# Patient Record
Sex: Female | Born: 1960 | Race: White | Hispanic: No | Marital: Single | Smoking: Never smoker
Health system: Southern US, Community
[De-identification: ages and names within clinical notes are randomized; demographics above are authoritative.]

## PROBLEM LIST (undated history)

## (undated) DIAGNOSIS — M459 Ankylosing spondylitis of unspecified sites in spine: Secondary | ICD-10-CM

## (undated) DIAGNOSIS — E785 Hyperlipidemia, unspecified: Secondary | ICD-10-CM

## (undated) DIAGNOSIS — G43909 Migraine, unspecified, not intractable, without status migrainosus: Secondary | ICD-10-CM

## (undated) DIAGNOSIS — I10 Essential (primary) hypertension: Secondary | ICD-10-CM

## (undated) HISTORY — DX: Ankylosing spondylitis of unspecified sites in spine: M45.9

## (undated) HISTORY — DX: Hyperlipidemia, unspecified: E78.5

## (undated) HISTORY — DX: Migraine, unspecified, not intractable, without status migrainosus: G43.909

## (undated) HISTORY — DX: Essential (primary) hypertension: I10

## (undated) HISTORY — PX: OTHER SURGICAL HISTORY: SHX169

## (undated) HISTORY — PX: COLONOSCOPY: SHX174

---
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
## 1980-02-13 HISTORY — PX: OTHER SURGICAL HISTORY: SHX169

## 1983-02-13 HISTORY — PX: OTHER SURGICAL HISTORY: SHX169

## 1986-02-12 HISTORY — PX: ABDOMINAL HYSTERECTOMY: SHX81

## 2016-04-13 DIAGNOSIS — M459 Ankylosing spondylitis of unspecified sites in spine: Secondary | ICD-10-CM | POA: Insufficient documentation

## 2016-04-13 DIAGNOSIS — I10 Essential (primary) hypertension: Secondary | ICD-10-CM | POA: Insufficient documentation

## 2016-04-13 DIAGNOSIS — G43909 Migraine, unspecified, not intractable, without status migrainosus: Secondary | ICD-10-CM | POA: Insufficient documentation

## 2017-05-17 LAB — HM MAMMOGRAPHY

## 2018-04-14 ENCOUNTER — Encounter: Payer: Self-pay | Admitting: Physician Assistant

## 2018-04-14 ENCOUNTER — Ambulatory Visit: Payer: BC Managed Care – PPO | Admitting: Physician Assistant

## 2018-04-14 VITALS — BP 120/82 | HR 115 | Temp 100.7°F | Ht 64.0 in | Wt 182.0 lb

## 2018-04-14 DIAGNOSIS — R6889 Other general symptoms and signs: Secondary | ICD-10-CM | POA: Diagnosis not present

## 2018-04-14 LAB — POC INFLUENZA A&B (BINAX/QUICKVUE)
Influenza A, POC: POSITIVE — AB
Influenza B, POC: NEGATIVE

## 2018-04-14 MED ORDER — OSELTAMIVIR PHOSPHATE 75 MG PO CAPS: 75.0000 mg | ORAL_CAPSULE | Freq: Two times a day (BID) | ORAL | 0 refills | Status: DC

## 2018-04-14 NOTE — Patient Instructions (Signed)
It was great to see you!  You have influenza A.  Start tamiflu.  Use tylenol to control fever.  Push fluids and get plenty of rest. Please return if you are not improving as expected, or if you have high fevers (>101.5) or difficulty swallowing or worsening productive cough.  Call clinic with questions.  I hope you start feeling better soon!

## 2018-04-14 NOTE — Progress Notes (Signed)
Alison Mahoney is a 58 y.o. female here for a new problem.  History of Present Illness:   Chief Complaint  Patient presents with  . Generalized Body Aches    x3 days  . Cough    wet cough  . Headache    HPI  Patient presents to establish care and discuss acute issue.  She has a history of high blood pressure and migraines.  She is here for generalized body aches  x 3 days, wet cough, headache started on Saturday night. Denies: sore throat. Denies hx of PNA, asthma. Does have history of flu. Has had chills. Appetite is good. Staying well hydrated.      Past Medical History:  Diagnosis Date  . Ankylosing spondylitis ([place])   . Hypertension   . Migraines      Social History   Socioeconomic History  . Marital status: Single    Spouse name: Not on file  . Number of children: 3  . Years of education: Not on file  . Highest education level: Not on file  Occupational History  . Not on file  Social Needs  . Financial resource strain: Not on file  . Food insecurity:    Worry: Not on file    Inability: Not on file  . Transportation needs:    Medical: Not on file    Non-medical: Not on file  Tobacco Use  . Smoking status: Never Smoker  . Smokeless tobacco: Never Used  Substance and Sexual Activity  . Alcohol use: Not Currently  . Drug use: Not Currently  . Sexual activity: Not Currently  Lifestyle  . Physical activity:    Days per week: Not on file    Minutes per session: Not on file  . Stress: Not on file  Relationships  . Social connections:    Talks on phone: Not on file    Gets together: Not on file    Attends religious service: Not on file    Active member of club or organization: Not on file    Attends meetings of clubs or organizations: Not on file    Relationship status: Not on file  . Intimate partner violence:    Fear of current or ex partner: Not on file    Emotionally abused: Not on file    Physically abused: Not on file    Forced sexual activity:  Not on file  Other Topics Concern  . Not on file  Social History Narrative   Moved from [place] in [place]; 3 children   Currently works as a Education officer, museum    Past Surgical History:  Procedure Laterality Date  . ABDOMINAL HYSTERECTOMY  1988  . lt hand surgery  1985  . Lt middle toe surgery  1977  . Rt thumb joint replacement  1982  . vaginal delivery x3  1979, 1982, 1986    History reviewed. No pertinent family history.  Allergies  Allergen Reactions  . Inderal [Propranolol] Rash    Current Medications:   Current Outpatient Medications:  .  b complex vitamins tablet, Take 1 tablet by mouth daily., Disp: , Rfl:  .  co-enzyme Q-10 30 MG capsule, Take 30 mg by mouth daily., Disp: , Rfl:  .  hydrochlorothiazide (HYDRODIURIL) 12.5 MG tablet, , Disp: , Rfl:  .  losartan (COZAAR) 100 MG tablet, , Disp: , Rfl:  .  oseltamivir (TAMIFLU) 75 MG capsule, Take 1 capsule (75 mg total) by mouth 2 (two) times daily.,  Disp: 10 capsule, Rfl: 0 .  SUMAtriptan (IMITREX) 100 MG tablet, , Disp: , Rfl:    Review of Systems:   Review of Systems  Constitutional: Positive for chills and fever. Negative for malaise/fatigue and weight loss.  HENT: Positive for congestion, ear pain and sore throat. Negative for ear discharge.   Gastrointestinal: Negative for abdominal pain, heartburn, nausea and vomiting.  Neurological: Negative for dizziness, sensory change and headaches.    Vitals:   Vitals:   04/14/18 1105  BP: 120/82  Pulse: (!) 115  Temp: (!) 100.7 F (38.2 C)  TempSrc: Oral  SpO2: 96%  Weight: 182 lb (82.6 kg)  Height: 5\' 4"  (1.626 m)     Body mass index is 31.24 kg/m.  Physical Exam:   Physical Exam Vitals signs and nursing note reviewed.  Constitutional:      General: She is not in acute distress.    Appearance: She is well-developed. She is not ill-appearing or toxic-appearing.  HENT:     Head: Normocephalic and atraumatic.     Right Ear: Ear canal  and external ear normal. A middle ear effusion is present. Tympanic membrane is not erythematous, retracted or bulging.     Left Ear: Ear canal and external ear normal. A middle ear effusion is present. Tympanic membrane is not erythematous, retracted or bulging.     Nose: Mucosal edema, congestion and rhinorrhea present.     Right Sinus: No maxillary sinus tenderness or frontal sinus tenderness.     Left Sinus: No maxillary sinus tenderness or frontal sinus tenderness.     Mouth/Throat:     Lips: Pink.     Mouth: Mucous membranes are moist.     Pharynx: Uvula midline. Posterior oropharyngeal erythema present.     Tonsils: Swelling: 0 on the right. 0 on the left.  Eyes:     General: Lids are normal.     Conjunctiva/sclera: Conjunctivae normal.  Neck:     Trachea: Trachea normal.  Cardiovascular:     Rate and Rhythm: Normal rate and regular rhythm.     Heart sounds: Normal heart sounds, S1 normal and S2 normal.  Pulmonary:     Effort: Pulmonary effort is normal.     Breath sounds: Normal breath sounds. No decreased breath sounds, wheezing, rhonchi or rales.  Lymphadenopathy:     Cervical: No cervical adenopathy.  Skin:    General: Skin is warm and dry.  Neurological:     Mental Status: She is alert.  Psychiatric:        Speech: Speech normal.        Behavior: Behavior normal. Behavior is cooperative.     Results for orders placed or performed in visit on 04/14/18  POC Influenza A&B(BINAX/QUICKVUE)  Result Value Ref Range   Influenza A, POC Positive (A) Negative   Influenza B, POC Negative Negative    Assessment and Plan:   Alison Mahoney was seen today for generalized body aches, cough and headache.  Diagnoses and all orders for this visit:  Flu-like symptoms -     POC Influenza A&B(BINAX/QUICKVUE)  Other orders -     oseltamivir (TAMIFLU) 75 MG capsule; Take 1 capsule (75 mg total) by mouth 2 (two) times daily.   No red flags on exam.  Flu A positive.  After shared decision  making, patient was agreeable to starting Tamiflu.  We did discuss side effects of medication.  Discussed taking medications as prescribed. Reviewed return precautions including worsening fever, SOB, worsening cough  or other concerns. Push fluids and rest. I recommend that patient follow-up if symptoms worsen or persist despite treatment x 7-10 days, sooner if needed.   . Reviewed expectations re: course of current medical issues. . Discussed self-management of symptoms. . Outlined signs and symptoms indicating need for more acute intervention. . Patient verbalized understanding and all questions were answered. . See orders for this visit as documented in the electronic medical record. . Patient received an After-Visit Summary.   [name], PA-C

## 2018-04-29 ENCOUNTER — Encounter: Payer: Self-pay | Admitting: Physician Assistant

## 2018-05-07 ENCOUNTER — Telehealth: Payer: Self-pay | Admitting: Physician Assistant

## 2018-05-07 NOTE — Telephone Encounter (Signed)
Please call pt and schedule WebEx visit with [facility]. Pt wants refill on blood pressure medication.

## 2018-05-07 NOTE — Telephone Encounter (Signed)
Copied from [place] [phone]. Topic: General - Other >> May 07, 2018  8:29 AM Lennox Solders wrote: Reason for CRM: pt left message on refill line and needs refills on losartan 100 mg and hctz 12.5. [facility] [address]. These medications will be new from [facility]

## 2018-05-07 NOTE — Telephone Encounter (Signed)
Called pt and left vm to schedule webex.  °

## 2018-05-12 ENCOUNTER — Other Ambulatory Visit: Payer: Self-pay

## 2018-05-12 ENCOUNTER — Ambulatory Visit (INDEPENDENT_AMBULATORY_CARE_PROVIDER_SITE_OTHER): Payer: BC Managed Care – PPO | Admitting: Physician Assistant

## 2018-05-12 ENCOUNTER — Encounter: Payer: Self-pay | Admitting: Physician Assistant

## 2018-05-12 DIAGNOSIS — I10 Essential (primary) hypertension: Secondary | ICD-10-CM | POA: Diagnosis not present

## 2018-05-12 NOTE — Progress Notes (Signed)
Virtual Visit via Video   I connected with Alison Mahoney on 05/12/18 at  8:20 AM EDT by a video enabled telemedicine application and verified that I am speaking with the correct person using two identifiers. Location patient: Home Location provider: [facility], Office Persons participating in the virtual visit: YEMARIAM MAGAR, [name], [place], [name], [place]    I discussed the limitations of evaluation and management by telemedicine and the availability of in person appointments. The patient expressed understanding and agreed to proceed.  Subjective:   HPI: Hypertension Pt for follow up on blood pressure needing refills. Currently on HCTZ 12.5 mg and Losartan 100 mg. 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 Pt has been on blood pressure medication for several years. She is unable to check blood pressure at home. Pt had one headache (Migraine) this past week. Pt denies dizziness, chest pain, SOB, or blurred vision. Pt denies concerns for sleep apnea.   ROS: See pertinent positives and negatives per HPI.  There are no active problems to display for this patient.   Social History   Tobacco Use  . Smoking status: Never Smoker  . Smokeless tobacco: Never Used  Substance Use Topics  . Alcohol use: Not Currently    Current Outpatient Medications:  .  b complex vitamins tablet, Take 1 tablet by mouth daily., Disp: , Rfl:  .  co-enzyme Q-10 30 MG capsule, Take 30 mg by mouth daily., Disp: , Rfl:  .  hydrochlorothiazide (HYDRODIURIL) 12.5 MG tablet, , Disp: , Rfl:  .  losartan (COZAAR) 100 MG tablet, , Disp: , Rfl:  .  OVER THE COUNTER MEDICATION, Take 2 tablets by mouth daily. Cardio Tonic BP, Disp: , Rfl:  .  SUMAtriptan (IMITREX) 100 MG tablet, , Disp: , Rfl:   Allergies  Allergen Reactions  . Inderal [Propranolol] Rash    Objective:   VITALS: Per patient if applicable, see vitals. GENERAL: Alert, appears well and in no acute distress. HEENT:  Atraumatic, conjunctiva clear, no obvious abnormalities on inspection of external nose and ears. NECK: Normal movements of the head and neck. CARDIOPULMONARY: No increased WOB. Speaking in clear sentences. I:E ratio WNL.  MS: Moves all visible extremities without noticeable abnormality. PSYCH: Pleasant and cooperative, well-groomed. Speech normal rate and rhythm. Affect is appropriate. Insight and judgement are appropriate. Attention is focused, linear, and appropriate.  NEURO: CN grossly intact. Oriented as arrived to appointment on time with no prompting. Moves both UE equally.  SKIN: No obvious lesions, wounds, erythema, or cyanosis noted on face or hands.  Assessment and Plan:    Alan was seen today for hypertension.  Diagnoses and all orders for this visit:  Essential hypertension -     Cancel: Basic metabolic panel -     Basic metabolic panel; Future   Patient is planning to come in to the office at 7:40 am on Wednesday for vitals and check BMP. Will adjust medications if needed, otherwise, continue current regimen.   . Reviewed expectations re: course of current medical issues. . Discussed self-management of symptoms. . Outlined signs and symptoms indicating need for more acute intervention. . Patient verbalized understanding and all questions were answered. Marland Kitchen Health Maintenance issues including appropriate healthy diet, exercise, and smoking avoidance were discussed with patient. . See orders for this visit as documented in the electronic medical record.  I discussed the assessment and treatment plan with the patient. The patient was provided an  opportunity to ask questions and all were answered. The patient agreed with the plan and demonstrated an understanding of the instructions.   The patient was advised to call back or seek an in-person evaluation if the symptoms worsen or if the condition fails to improve as anticipated.  CMA or LPN served as scribe during this visit.  History, Physical, and Plan performed by medical provider. The above documentation has been reviewed and is accurate and complete.   I provided 25 minutes of non-face-to-face time during this encounter.   [place], [place] 05/12/2018

## 2018-05-12 NOTE — Patient Instructions (Signed)
Pt to come in to the office on Wednesday for Blood pressure check and labs.  Appointment scheduled at 7:40 with [facility].

## 2018-05-14 ENCOUNTER — Ambulatory Visit (INDEPENDENT_AMBULATORY_CARE_PROVIDER_SITE_OTHER): Payer: BC Managed Care – PPO | Admitting: *Deleted

## 2018-05-14 ENCOUNTER — Other Ambulatory Visit: Payer: Self-pay

## 2018-05-14 ENCOUNTER — Other Ambulatory Visit: Payer: Self-pay | Admitting: Physician Assistant

## 2018-05-14 ENCOUNTER — Ambulatory Visit: Payer: BC Managed Care – PPO | Admitting: Physician Assistant

## 2018-05-14 ENCOUNTER — Encounter: Payer: Self-pay | Admitting: *Deleted

## 2018-05-14 DIAGNOSIS — Z13220 Encounter for screening for lipoid disorders: Secondary | ICD-10-CM

## 2018-05-14 DIAGNOSIS — Z136 Encounter for screening for cardiovascular disorders: Secondary | ICD-10-CM

## 2018-05-14 DIAGNOSIS — I10 Essential (primary) hypertension: Secondary | ICD-10-CM

## 2018-05-14 LAB — BASIC METABOLIC PANEL
BUN: 15 mg/dL (ref 6–23)
CO2: 30 mEq/L (ref 19–32)
Calcium: 9.4 mg/dL (ref 8.4–10.5)
Chloride: 104 mEq/L (ref 96–112)
Creatinine, Ser: 0.74 mg/dL (ref 0.40–1.20)
GFR: 80.04 mL/min (ref >60.00)
Glucose, Bld: 109 mg/dL — ABNORMAL HIGH (ref 70–99)
Potassium: 4.2 mEq/L (ref 3.5–5.1)
Sodium: 142 mEq/L (ref 135–145)

## 2018-05-14 LAB — LIPID PANEL
Cholesterol: 217 mg/dL — ABNORMAL HIGH (ref 0–200)
HDL: 43.3 mg/dL (ref >39.00)
LDL Cholesterol: 149 mg/dL — ABNORMAL HIGH (ref 0–99)
NonHDL: 174.01
Total CHOL/HDL Ratio: 5
Triglycerides: 125 mg/dL (ref 0.0–149.0)
VLDL: 25 mg/dL (ref 0.0–40.0)

## 2018-05-14 NOTE — Progress Notes (Addendum)
Pt here today for blood pressure check. Blood pressure controlled. Pt denies dizziness, blurred vision, chest pain or SOB. Samantha notified of pt's blood pressure and said no changes. Pt told to continue medication as directed, no changes. Pt verbalized understanding.   Agree with the above assessment and plan.  [name] PA-C

## 2018-05-14 NOTE — Addendum Note (Signed)
Addended by: Tomi Likens on: 05/14/2018 08:57 AM   Modules accepted: Orders

## 2018-06-05 ENCOUNTER — Other Ambulatory Visit: Payer: Self-pay | Admitting: *Deleted

## 2018-06-05 MED ORDER — HYDROCHLOROTHIAZIDE 12.5 MG PO TABS: 12.5000 mg | ORAL_TABLET | Freq: Every day | ORAL | 0 refills | Status: DC

## 2018-08-29 ENCOUNTER — Other Ambulatory Visit: Payer: Self-pay | Admitting: Physician Assistant

## 2018-10-21 ENCOUNTER — Telehealth: Payer: Self-pay | Admitting: Physician Assistant

## 2018-10-21 NOTE — Telephone Encounter (Signed)
Please advise on scheduling lab appt with no orders for 10/21   Copied from [place] [id number]. Topic: General - Inquiry >> Oct 21, 2018  4:57 PM Mathis Bud wrote: Reason for CRM: Patient has CPE appt and flu shot appt 10/21  and would like to request a lab appt on that same day. She does not want to come into office due to covid.  Patient would like everything on the same day.  Patient has no future labs so could not schedule. Patient call back [phone]

## 2018-10-22 NOTE — Telephone Encounter (Signed)
Spoke to pt told her labs will be drawn day of physical. It will all be done that day. Pt verbalized understanding.

## 2018-12-03 ENCOUNTER — Encounter: Payer: Self-pay | Admitting: Physician Assistant

## 2018-12-03 ENCOUNTER — Ambulatory Visit: Payer: BC Managed Care – PPO

## 2018-12-03 ENCOUNTER — Ambulatory Visit (INDEPENDENT_AMBULATORY_CARE_PROVIDER_SITE_OTHER): Payer: BC Managed Care – PPO | Admitting: Physician Assistant

## 2018-12-03 ENCOUNTER — Encounter: Payer: Self-pay | Admitting: Family Medicine

## 2018-12-03 ENCOUNTER — Other Ambulatory Visit: Payer: Self-pay

## 2018-12-03 VITALS — BP 122/80 | HR 104 | Temp 97.7°F | Ht 64.0 in | Wt 185.4 lb

## 2018-12-03 DIAGNOSIS — N3946 Mixed incontinence: Secondary | ICD-10-CM | POA: Diagnosis not present

## 2018-12-03 DIAGNOSIS — I10 Essential (primary) hypertension: Secondary | ICD-10-CM

## 2018-12-03 DIAGNOSIS — Z13220 Encounter for screening for lipoid disorders: Secondary | ICD-10-CM | POA: Diagnosis not present

## 2018-12-03 DIAGNOSIS — Z114 Encounter for screening for human immunodeficiency virus [HIV]: Secondary | ICD-10-CM | POA: Diagnosis not present

## 2018-12-03 DIAGNOSIS — Z23 Encounter for immunization: Secondary | ICD-10-CM

## 2018-12-03 DIAGNOSIS — Z1211 Encounter for screening for malignant neoplasm of colon: Secondary | ICD-10-CM

## 2018-12-03 DIAGNOSIS — Z136 Encounter for screening for cardiovascular disorders: Secondary | ICD-10-CM | POA: Diagnosis not present

## 2018-12-03 DIAGNOSIS — M459 Ankylosing spondylitis of unspecified sites in spine: Secondary | ICD-10-CM

## 2018-12-03 DIAGNOSIS — Z1159 Encounter for screening for other viral diseases: Secondary | ICD-10-CM

## 2018-12-03 DIAGNOSIS — R5381 Other malaise: Secondary | ICD-10-CM | POA: Diagnosis not present

## 2018-12-03 DIAGNOSIS — G43809 Other migraine, not intractable, without status migrainosus: Secondary | ICD-10-CM

## 2018-12-03 DIAGNOSIS — Z0000 Encounter for general adult medical examination without abnormal findings: Secondary | ICD-10-CM | POA: Diagnosis not present

## 2018-12-03 LAB — CBC WITH DIFFERENTIAL/PLATELET
Basophils Absolute: 0 10*3/uL (ref 0.0–0.1)
Basophils Relative: 0.7 % (ref 0.0–3.0)
Eosinophils Absolute: 0.1 10*3/uL (ref 0.0–0.7)
Eosinophils Relative: 1.7 % (ref 0.0–5.0)
HCT: 46.3 % — ABNORMAL HIGH (ref 36.0–46.0)
Hemoglobin: 15.4 g/dL — ABNORMAL HIGH (ref 12.0–15.0)
Lymphocytes Relative: 25.9 % (ref 12.0–46.0)
Lymphs Abs: 1.3 10*3/uL (ref 0.7–4.0)
MCHC: 33.3 g/dL (ref 30.0–36.0)
MCV: 91.1 fl (ref 78.0–100.0)
Monocytes Absolute: 0.4 10*3/uL (ref 0.1–1.0)
Monocytes Relative: 7.9 % (ref 3.0–12.0)
Neutro Abs: 3.3 10*3/uL (ref 1.4–7.7)
Neutrophils Relative %: 63.8 % (ref 43.0–77.0)
Platelets: 227 10*3/uL (ref 150.0–400.0)
RBC: 5.09 Mil/uL (ref 3.87–5.11)
RDW: 13.8 % (ref 11.5–15.5)
WBC: 5.2 10*3/uL (ref 4.0–10.5)

## 2018-12-03 LAB — COMPREHENSIVE METABOLIC PANEL
ALT: 11 U/L (ref 0–35)
AST: 19 U/L (ref 0–37)
Albumin: 4.6 g/dL (ref 3.5–5.2)
Alkaline Phosphatase: 77 U/L (ref 39–117)
BUN: 11 mg/dL (ref 6–23)
CO2: 30 mEq/L (ref 19–32)
Calcium: 9.6 mg/dL (ref 8.4–10.5)
Chloride: 103 mEq/L (ref 96–112)
Creatinine, Ser: 0.7 mg/dL (ref 0.40–1.20)
GFR: 85.18 mL/min (ref >60.00)
Glucose, Bld: 107 mg/dL — ABNORMAL HIGH (ref 70–99)
Potassium: 4.7 mEq/L (ref 3.5–5.1)
Sodium: 140 mEq/L (ref 135–145)
Total Bilirubin: 0.7 mg/dL (ref 0.2–1.2)
Total Protein: 6.8 g/dL (ref 6.0–8.3)

## 2018-12-03 LAB — LIPID PANEL
Cholesterol: 229 mg/dL — ABNORMAL HIGH (ref 0–200)
HDL: 46.2 mg/dL (ref >39.00)
LDL Cholesterol: 153 mg/dL — ABNORMAL HIGH (ref 0–99)
NonHDL: 183.02
Total CHOL/HDL Ratio: 5
Triglycerides: 151 mg/dL — ABNORMAL HIGH (ref 0.0–149.0)
VLDL: 30.2 mg/dL (ref 0.0–40.0)

## 2018-12-03 MED ORDER — HYDROCHLOROTHIAZIDE 12.5 MG PO TABS: 12.5000 mg | ORAL_TABLET | Freq: Every day | ORAL | 1 refills | Status: DC

## 2018-12-03 MED ORDER — LOSARTAN POTASSIUM 100 MG PO TABS: 100.0000 mg | ORAL_TABLET | Freq: Every day | ORAL | 1 refills | Status: DC

## 2018-12-03 MED ORDER — SUMATRIPTAN SUCCINATE 100 MG PO TABS: 100.0000 mg | ORAL_TABLET | ORAL | 3 refills | Status: DC | PRN

## 2018-12-03 NOTE — Patient Instructions (Signed)
It was great to see you!  Please go to the lab for blood work.   You will be contacted about your referrals. Let's follow-up in 3-6 months to touch base on the issues we didn't get to address today.  Our office will call you with your results unless you have chosen to receive results via MyChart.  If your blood work is normal we will follow-up each year for physicals and as scheduled for chronic medical problems.  If anything is abnormal we will treat accordingly and get you in for a follow-up.  Take care,  [facility] Maintenance, Female Adopting a healthy lifestyle and getting preventive care are important in promoting health and wellness. Ask your health care provider about:  The right schedule for you to have regular tests and exams.  Things you can do on your own to prevent diseases and keep yourself healthy. What should I know about diet, weight, and exercise? Eat a healthy diet   Eat a diet that includes plenty of vegetables, fruits, low-fat dairy products, and lean protein.  Do not eat a lot of foods that are high in solid fats, added sugars, or sodium. Maintain a healthy weight Body mass index (BMI) is used to identify weight problems. It estimates body fat based on height and weight. Your health care provider can help determine your BMI and help you achieve or maintain a healthy weight. Get regular exercise Get regular exercise. This is one of the most important things you can do for your health. Most adults should:  Exercise for at least 150 minutes each week. The exercise should increase your heart rate and make you sweat (moderate-intensity exercise).  Do strengthening exercises at least twice a week. This is in addition to the moderate-intensity exercise.  Spend less time sitting. Even light physical activity can be beneficial. Watch cholesterol and blood lipids Have your blood tested for lipids and cholesterol at 60 years of age, then have this test  every 5 years. Have your cholesterol levels checked more often if:  Your lipid or cholesterol levels are high.  You are older than 60 years of age.  You are at high risk for heart disease. What should I know about cancer screening? Depending on your health history and family history, you may need to have cancer screening at various ages. This may include screening for:  Breast cancer.  Cervical cancer.  Colorectal cancer.  Skin cancer.  Lung cancer. What should I know about heart disease, diabetes, and high blood pressure? Blood pressure and heart disease  High blood pressure causes heart disease and increases the risk of stroke. This is more likely to develop in people who have high blood pressure readings, are of African descent, or are overweight.  Have your blood pressure checked: ? Every 3-5 years if you are 58-14 years of age. ? Every year if you are 18 years old or older. Diabetes Have regular diabetes screenings. This checks your fasting blood sugar level. Have the screening done:  Once every three years after age 49 if you are at a normal weight and have a low risk for diabetes.  More often and at a younger age if you are overweight or have a high risk for diabetes. What should I know about preventing infection? Hepatitis B If you have a higher risk for hepatitis B, you should be screened for this virus. Talk with your health care provider to find out if you are at risk for hepatitis  B infection. Hepatitis C Testing is recommended for:  Everyone born from [birth year] through 1967.  Anyone with known risk factors for hepatitis C. Sexually transmitted infections (STIs)  Get screened for STIs, including gonorrhea and chlamydia, if: ? You are sexually active and are younger than 60 years of age. ? You are older than 60 years of age and your health care provider tells you that you are at risk for this type of infection. ? Your sexual activity has changed since you were  last screened, and you are at increased risk for chlamydia or gonorrhea. Ask your health care provider if you are at risk.  Ask your health care provider about whether you are at high risk for HIV. Your health care provider may recommend a prescription medicine to help prevent HIV infection. If you choose to take medicine to prevent HIV, you should first get tested for HIV. You should then be tested every 3 months for as long as you are taking the medicine. Pregnancy  If you are about to stop having your period (premenopausal) and you may become pregnant, seek counseling before you get pregnant.  Take 400 to 800 micrograms (mcg) of folic acid every day if you become pregnant.  Ask for birth control (contraception) if you want to prevent pregnancy. Osteoporosis and menopause Osteoporosis is a disease in which the bones lose minerals and strength with aging. This can result in bone fractures. If you are 52 years old or older, or if you are at risk for osteoporosis and fractures, ask your health care provider if you should:  Be screened for bone loss.  Take a calcium or vitamin D supplement to lower your risk of fractures.  Be given hormone replacement therapy (HRT) to treat symptoms of menopause. Follow these instructions at home: Lifestyle  Do not use any products that contain nicotine or tobacco, such as cigarettes, e-cigarettes, and chewing tobacco. If you need help quitting, ask your health care provider.  Do not use street drugs.  Do not share needles.  Ask your health care provider for help if you need support or information about quitting drugs. Alcohol use  Do not drink alcohol if: ? Your health care provider tells you not to drink. ? You are pregnant, may be pregnant, or are planning to become pregnant.  If you drink alcohol: ? Limit how much you use to 0-1 drink a day. ? Limit intake if you are breastfeeding.  Be aware of how much alcohol is in your drink. In the [place]  one drink equals one 12 oz bottle of beer (355 mL), one 5 oz glass of wine (148 mL), or one 1 oz glass of hard liquor (44 mL). General instructions  Schedule regular health, dental, and eye exams.  Stay current with your vaccines.  Tell your health care provider if: ? You often feel depressed. ? You have ever been abused or do not feel safe at home. Summary  Adopting a healthy lifestyle and getting preventive care are important in promoting health and wellness.  Follow your health care provider's instructions about healthy diet, exercising, and getting tested or screened for diseases.  Follow your health care provider's instructions on monitoring your cholesterol and blood pressure. This information is not intended to replace advice given to you by your health care provider. Make sure you discuss any questions you have with your health care provider. Document Released: 08/14/2010 Document Revised: 01/22/2018 Document Reviewed: 01/22/2018 Elsevier Patient Education  2020 [employer].

## 2018-12-03 NOTE — Progress Notes (Signed)
I acted as a Education administrator for [employer], PA-C [employer], LPN   Subjective:    Alison Mahoney is a 58 y.o. female and is here for a comprehensive physical exam.   HPI  Health Maintenance Due  Topic Date Due  . Hepatitis C Screening  1960-10-06  . HIV Screening  04/18/1973  . Fecal DNA (Cologuard)  04/18/2008    Acute Concerns: Urinary incontinence -- she has noticed that she is starting to have more frequent episodes bladder incontinence when she goes from sitting to standing. She improved without medication such as with physical therapy.  She denies any dysuria, unusual back pain, hematuria. Issues with balance/deconditioning --she does no exercise presently and she would like to get back into exercise however she has concerns that she is not strong enough and has problems with balance. Vertigo--reports that she had an episode of vertigo that caused the sensation of the room to be spinning.  It was only when she moves her head in certain positions.  It essentially resolved.  Denies any residual symptoms or any changes in vision, slurred speech, weakness on one side of the body.  Chronic Issues: Ankylosing spondylosis --rheumatologist several years ago and was on medication for a year or 2.  She is interested in seeing a rheumatologist now and also discussing what her current options for treatment.  She has been following an anti-inflammatory diet which has helped her symptoms somewhat. Migraines --takes Imitrex once a week.  She states that this regimen works well.  She has been on other medications in the past without much success.  She denies changes to her migraine symptoms. HTN -- Currently taking HCTZ 12.5 mg daily and Cozaar 100 mg daily. Patient denies chest pain, SOB, blurred vision, dizziness, unusual headaches, lower leg swelling. Patient is  compliant with medication. Denies excessive caffeine intake, stimulant usage, excessive alcohol intake, or increase in salt  consumption.    Health Maintenance: Immunizations -- will give Flu& Tdap today Colonoscopy --Cologuard ordered today Mammogram -- Pt will schedule PAP -- N/A Hysterectomy Bone Density -- never  Diet -- feels like she eats healthy options, mostly plant-based Sleep habits --denies issues Exercise --none Weight -- Weight: 185 lb 6.1 oz (84.1 kg)  Mood --stable Weight history: Wt Readings from Last 10 Encounters:  12/03/18 185 lb 6.1 oz (84.1 kg)  04/14/18 182 lb (82.6 kg)   No LMP recorded. Patient has had a hysterectomy.  Alcohol use: none presently Tobacco use: none  Depression screen PHQ 2/9 12/03/2018  Decreased Interest 0  Down, Depressed, Hopeless 0  PHQ - 2 Score 0     Other providers/specialists: Patient Care Team: [name], [place] as PCP - General (Physician Assistant)    PMHx, SurgHx, SocialHx, Medications, and Allergies were reviewed in the Visit Navigator and updated as appropriate.   Past Medical History:  Diagnosis Date  . Ankylosing spondylitis ([place])    35 years ago  . Hyperlipidemia   . Hypertension   . Migraines      Past Surgical History:  Procedure Laterality Date  . ABDOMINAL HYSTERECTOMY  1988  . lt hand surgery  1985  . Lt middle toe surgery  1977  . Rt thumb joint replacement  1982  . vaginal delivery x3  1979, 1982, 1986     Family History  Problem Relation Age of Onset  . Arthritis Mother   . Arthritis Father   . Cancer Maternal Grandmother   . Cancer Paternal Grandmother   .  Cancer Paternal Grandfather   . Diabetes Paternal Grandfather     Social History   Tobacco Use  . Smoking status: Never Smoker  . Smokeless tobacco: Never Used  Substance Use Topics  . Alcohol use: Not Currently  . Drug use: Not Currently    Review of Systems:   Review of Systems  Constitutional: Negative for chills, fever, malaise/fatigue and weight loss.  HENT: Negative for hearing loss, sinus pain and sore throat.   Respiratory:  Negative for cough and hemoptysis.   Cardiovascular: Negative for chest pain, palpitations, leg swelling and PND.  Gastrointestinal: Negative for abdominal pain, constipation, diarrhea, heartburn, nausea and vomiting.  Genitourinary: Positive for urgency. Negative for dysuria and frequency.  Musculoskeletal: Positive for back pain. Negative for myalgias and neck pain.  Skin: Negative for itching and rash.  Neurological: Positive for headaches. Negative for dizziness, tingling and seizures.  Endo/Heme/Allergies: Negative for polydipsia.  Psychiatric/Behavioral: Negative for depression. The patient is not nervous/anxious.     Objective:   BP 122/80 (BP Location: Left Arm, Patient Position: Sitting, Cuff Size: Large)   Pulse (!) 104   Temp 97.7 F (36.5 C) (Temporal)   Ht 5\' 4"  (1.626 m)   Wt 185 lb 6.1 oz (84.1 kg)   SpO2 97%   BMI 31.82 kg/m  Body mass index is 31.82 kg/m.   General Appearance:    Alert, cooperative, no distress, appears stated age  Head:    Normocephalic, without obvious abnormality, atraumatic  Eyes:    PERRL, conjunctiva/corneas clear, EOM's intact, fundi    benign, both eyes  Ears:    Normal TM's and external ear canals, both ears  Nose:   Nares normal, septum midline, mucosa normal, no drainage    or sinus tenderness  Throat:   Lips, mucosa, and tongue normal; teeth and gums normal  Neck:   Supple, symmetrical, trachea midline, no adenopathy;    thyroid:  no enlargement/tenderness/nodules; no carotid   bruit or JVD  Back:     Symmetric, no curvature, ROM normal, no CVA tenderness  Lungs:     Clear to auscultation bilaterally, respirations unlabored  Chest Wall:    No tenderness or deformity   Heart:    Regular rate and rhythm, S1 and S2 normal, no murmur, rub or gallop  Breast Exam:    No tenderness, masses, or nipple abnormality  Abdomen:     Soft, non-tender, bowel sounds active all four quadrants,    no masses, no organomegaly  Genitalia:    Deferred   Extremities:   Extremities normal, atraumatic, no cyanosis or edema  Pulses:   2+ and symmetric all extremities  Skin:   Skin color, texture, turgor normal, no rashes or lesions  Lymph nodes:   Cervical, supraclavicular, and axillary nodes normal  Neurologic:   CNII-XII intact, normal strength, sensation and reflexes    throughout     Assessment/Plan:   Brittanee was seen today for annual exam.  Diagnoses and all orders for this visit:  Routine physical examination Today patient counseled on age appropriate routine health concerns for screening and prevention, each reviewed and up to date or declined. Immunizations reviewed and up to date or declined. Labs ordered and reviewed. Risk factors for depression reviewed and negative. Hearing function and visual acuity are intact. ADLs screened and addressed as needed. Functional ability and level of safety reviewed and appropriate. Education, counseling and referrals performed based on assessed risks today. Patient provided with a copy of personalized plan  for preventive services.  Migraines Well controlled, continue current regimen.  Encounter for lipid screening for cardiovascular disease -     Lipid panel  Essential hypertension Well controlled, continue HCTZ and cozaar. Follow-up in 6 months. -     CBC with Differential/Platelet -     Comprehensive metabolic panel  Ankylosing spondylitis, unspecified site of spine [facility]) Referral to rheumatology. -     Ambulatory referral to Rheumatology -     Ambulatory referral to Physical Therapy  Physical deconditioning PT referral with Lyndee Hensen. 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 -     Ambulatory referral to Physical Therapy  Screening for HIV (human immunodeficiency virus) -     HIV Antibody (routine testing w rflx)  Encounter for screening for other  viral diseases -     Hepatitis C Antibody  Obesity Encouraged her to start being more active after attending PT to get a better idea of what she can do to be active and not cause pain. Continue to work on diet.  Need for immunization against influenza -     Flu Vaccine QUAD 36+ mos IM  Need for prophylactic vaccination with combined diphtheria-tetanus-pertussis (DTP) vaccine -     Tdap vaccine greater than or equal to 7yo IM  Other orders -     SUMAtriptan (IMITREX) 100 MG tablet; Take 1 tablet (100 mg total) by mouth every 2 (two) hours as needed for migraine. As needed for Migraines -     losartan (COZAAR) 100 MG tablet; Take 1 tablet (100 mg total) by mouth daily. -     hydrochlorothiazide (HYDRODIURIL) 12.5 MG tablet; Take 1 tablet (12.5 mg total) by mouth daily.  Well Adult Exam: Labs ordered: Yes. Patient counseling was done. See below for items discussed. Discussed the patient's BMI.  The BMI is not in the acceptable range; BMI management plan is completed Follow up in 3 months. Breast cancer screening: pt to schedule. Cervical cancer screening: n/a   Patient Counseling: [x]    Nutrition: Stressed importance of moderation in sodium/caffeine intake, saturated fat and cholesterol, caloric balance, sufficient intake of fresh fruits, vegetables, fiber, calcium, iron, and 1 mg of folate supplement per day (for females capable of pregnancy).  [x]    Stressed the importance of regular exercise.   [x]    Substance Abuse: Discussed cessation/primary prevention of tobacco, alcohol, or other drug use; driving or other dangerous activities under the influence; availability of treatment for abuse.   [x]    Injury prevention: Discussed safety belts, safety helmets, smoke detector, smoking near bedding or upholstery.   [x]    Sexuality: Discussed sexually transmitted diseases, partner selection, use of condoms, avoidance of unintended pregnancy  and contraceptive alternatives.  [x]    Dental health:  Discussed importance of regular tooth brushing, flossing, and dental visits.  [x]    Health maintenance and immunizations reviewed. Please refer to Health maintenance section.   CMA or LPN served as scribe during this visit. History, Physical, and Plan performed by medical provider. The above documentation has been reviewed and is accurate and complete.   [name], PA-C [place]

## 2018-12-05 LAB — HEPATITIS C ANTIBODY
Hepatitis C Ab: NONREACTIVE
SIGNAL TO CUT-OFF: 0.01 (ref <1.00)

## 2018-12-05 LAB — HIV ANTIBODY (ROUTINE TESTING W REFLEX): HIV 1&2 Ab, 4th Generation: NONREACTIVE

## 2018-12-18 ENCOUNTER — Other Ambulatory Visit: Payer: Self-pay

## 2018-12-18 ENCOUNTER — Ambulatory Visit: Payer: BC Managed Care – PPO | Admitting: Physical Therapy

## 2018-12-18 ENCOUNTER — Encounter: Payer: Self-pay | Admitting: Physical Therapy

## 2018-12-18 DIAGNOSIS — G8929 Other chronic pain: Secondary | ICD-10-CM

## 2018-12-18 DIAGNOSIS — M545 Low back pain, unspecified: Secondary | ICD-10-CM

## 2018-12-18 DIAGNOSIS — M6281 Muscle weakness (generalized): Secondary | ICD-10-CM

## 2018-12-22 ENCOUNTER — Encounter: Payer: Self-pay | Admitting: Physical Therapy

## 2018-12-23 NOTE — Therapy (Signed)
[facility] [address] [place], [place], [address] Phone: [phone]   Fax:  [phone]  Physical Therapy Evaluation  Patient Details  Name: Alison Mahoney MRN: [id number] Date of Birth: [birth year] Referring Provider (PT): [name]   Encounter Date: 12/18/2018  PT End of Session - 12/22/18 1510    Visit Number  1    Number of Visits  12    Date for PT Re-Evaluation  02/02/19    Authorization Type  BCBS    PT Start Time  1600    PT Stop Time  [id number]    PT Time Calculation (min)  47 min    Activity Tolerance  Patient tolerated treatment well    Behavior During Therapy  [facility] for tasks assessed/performed       Past Medical History:  Diagnosis Date  . Ankylosing spondylitis ([place])    35 years ago  . Hyperlipidemia   . Hypertension   . Migraines     Past Surgical History:  Procedure Laterality Date  . ABDOMINAL HYSTERECTOMY  1988  . lt hand surgery  1985  . Lt middle toe surgery  1977  . Rt thumb joint replacement  1982  . vaginal delivery x3  1979, 1982, 1986    There were no vitals filed for this visit.   Subjective Assessment - 12/23/18 2214    Subjective  Pt with dx of ankylosing spondylitis for several years. She was having issues with her neck, but now is not. She states "pain all over", R ankle, R low back/SI, Bil Thumbs/hands, and L toe/bunion. Pt does not have actual pain associated with ankylosing spondylitis. She states that she wants to be more active and start strengthening, but pain is limiting her.    Limitations  Lifting;Standing;Walking;House hold activities    Currently in Pain?  Yes    Pain Score  3     Pain Location  Other (Comment)    Pain Orientation  Right;Left    Pain Descriptors / Indicators  Aching    Pain Type  Chronic pain    Pain Radiating Towards  Pain in multiple locations, R ankle, L toe, R SI, bil thumbs    Pain Onset  More than a month ago    Pain Frequency  Intermittent    Aggravating Factors   increased activity    Pain Relieving Factors  none stated         [facility] PT Assessment - 12/23/18 0001      Assessment   Medical Diagnosis  ankylosing spondylitis    Referring Provider (PT)  [name]    Prior Therapy  no      Balance Screen   Has the patient fallen in the past 6 months  No      Prior Function   Level of Independence  Independent      Cognition   Overall Cognitive Status  Within Functional Limits for tasks assessed      Posture/Postural Control   Posture Comments  rounded shoulders, poor seated posture       AROM   Overall AROM Comments  UE: WFL,  LE: WFL,  Lumbar: WFL,  Mild stiffness in R ankle.       Strength   Overall Strength Comments  UE: 4/5 gross,  LE: 4/5 gross,       Palpation   Palpation comment  Soreness in R low back, SI,   L great toe/bunion sore,  Bil MC joint  soreness,       Special Tests   Other special tests  Neg Hip fadir/fadir bil, Neg SLR,                 Objective measurements completed on examination: See above findings.              PT Education - 12/22/18 1510    Education Details  PT POC, Exam findings.    Person(s) Educated  Patient    Methods  Explanation    Comprehension  Verbalized understanding       PT Short Term Goals - 12/22/18 1513      PT SHORT TERM GOAL #1   Title  Pt to be independent with initial HEP    Time  2    Period  Weeks    Status  New    Target Date  01/01/19        PT Long Term Goals - 12/22/18 1513      PT LONG TERM GOAL #1   Title  Pt to demo improved strength of LEs to at least 4+/5 to improve stability and pain.    Time  6    Period  Weeks    Status  New    Target Date  01/29/19      PT LONG TERM GOAL #2   Title  Pt to verbalize and/or obtain proper footwear for her foot type, to improve pain    Time  6    Period  Weeks    Status  New    Target Date  01/29/19      PT LONG TERM GOAL #3   Title  Pt to report compliance with  exercise program at least 3 days/week, for improved LE/UE and core strength.    Time  6    Period  Weeks    Status  New    Target Date  01/29/19      PT LONG TERM GOAL #4   Title  Pt to demo ability for optimal seated posture, and correct mechanics with bending, lifting, and IADLS.    Time  6    Period  Weeks    Status  New    Target Date  01/29/19             Plan - 12/23/18 2200    Clinical Impression Statement  Pt presents with pain in multiple locations, mainly low back and R ankle. She has only mildROM deficits, but does have strength loss in LEs and core. Pt with lack of effective HEP, and would like to learn best things to do for exercise and healthy back. Pt to benefit from skilled PT for formal HEP instruction for strengthening,  with attention to pain in multiple joints. Pt to benefit from education on posture for sitting, work duties, computer, as well as bending, ifting.    Personal Factors and Comorbidities  Comorbidity 1    Comorbidities  Pain in multiple joints.    Examination-Activity Limitations  Locomotion Level;Squat;Lift;Stand;Bend    Examination-Participation Restrictions  Cleaning;Community Activity;Laundry    Stability/Clinical Decision Making  Stable/Uncomplicated    Clinical Decision Making  Low    Rehab Potential  Good    PT Frequency  2x / week    PT Duration  6 weeks    PT Treatment/Interventions  ADLs/Self Care Home Management;Cryotherapy;Electrical Stimulation;DME Instruction;Ultrasound;Traction;Moist Heat;Iontophoresis 4mg /ml Dexamethasone;Gait training;Stair training;Functional mobility training;Therapeutic activities;Therapeutic exercise;Balance training;Neuromuscular re-education;Manual techniques;Orthotic Fit/Training;Patient/family education;Passive range of motion;Dry needling;Taping;Spinal Manipulations;Joint Manipulations  Consulted and Agree with Plan of Care  Patient       Patient will benefit from skilled therapeutic intervention in  order to improve the following deficits and impairments:  Decreased range of motion, Difficulty walking, Decreased safety awareness, Decreased endurance, Decreased activity tolerance, Pain, Improper body mechanics, Impaired flexibility, Decreased mobility, Decreased strength  Visit Diagnosis: Chronic bilateral low back pain without sciatica  Muscle weakness (generalized)     Problem List Patient Active Problem List   Diagnosis Date Noted  . Hypertensive disorder 04/13/2016  . Migraine 04/13/2016  . Ankylosing spondylitis ([place]) 04/13/2016   Lyndee Hensen, PT, DPT 10:16 PM  12/23/18    [facility] [place], [place], [address] Phone: [phone]   Fax:  [phone]  Name: Alison Mahoney MRN: [id number] Date of Birth: 07-01-1960

## 2018-12-24 ENCOUNTER — Encounter: Payer: Self-pay | Admitting: Physical Therapy

## 2018-12-24 ENCOUNTER — Ambulatory Visit: Payer: BC Managed Care – PPO | Admitting: Physical Therapy

## 2018-12-24 ENCOUNTER — Other Ambulatory Visit: Payer: Self-pay

## 2018-12-24 DIAGNOSIS — M545 Low back pain: Secondary | ICD-10-CM

## 2018-12-24 DIAGNOSIS — G8929 Other chronic pain: Secondary | ICD-10-CM

## 2018-12-24 DIAGNOSIS — M6281 Muscle weakness (generalized): Secondary | ICD-10-CM

## 2018-12-24 NOTE — Patient Instructions (Signed)
Access Code: [email or web address]  URL: [email or web address]  Date: 12/24/2018  Prepared by: Lyndee Hensen   Exercises Seated Hamstring Stretch - 3 reps - 30 hold - 2x daily Seated Piriformis Stretch with Trunk Bend - 3 reps - 30 hold - 2x daily Supine Double Knee to Chest - 3 reps - 30 hold - 2x daily Supine Lower Trunk Rotation - 10 reps - 10 hold - 2x daily Straight Leg Raise - 10 reps - 2 sets - 1x daily Sidelying Hip Abduction - 10 reps - 2 sets - 1x daily Supine Bridge - 10 reps - 2 sets - 1x daily

## 2018-12-24 NOTE — Therapy (Signed)
[facility] [address] [place], [place], [address] Phone: [phone]   Fax:  [phone]  Physical Therapy Treatment  Patient Details  Name: Alison Mahoney MRN: [id number] Date of Birth: March 12, 1960 Referring Provider (PT): [name]   Encounter Date: 12/24/2018  PT End of Session - 12/24/18 0815    Visit Number  2    Number of Visits  12    Date for PT Re-Evaluation  02/02/19    Authorization Type  BCBS    PT Start Time  0805    PT Stop Time  0846    PT Time Calculation (min)  41 min    Activity Tolerance  Patient tolerated treatment well    Behavior During Therapy  [facility] for tasks assessed/performed       Past Medical History:  Diagnosis Date  . Ankylosing spondylitis ([place])    35 years ago  . Hyperlipidemia   . Hypertension   . Migraines     Past Surgical History:  Procedure Laterality Date  . ABDOMINAL HYSTERECTOMY  1988  . lt hand surgery  1985  . Lt middle toe surgery  1977  . Rt thumb joint replacement  1982  . vaginal delivery x3  1979, 1982, 1986    There were no vitals filed for this visit.  Subjective Assessment - 12/24/18 0814    Subjective  Pt states "whole L side is sore" knee, ankle, and L thumb.    Currently in Pain?  Yes    Pain Score  5     Pain Location  Other (Comment)    Pain Orientation  Left    Pain Descriptors / Indicators  Aching    Pain Type  Chronic pain    Pain Onset  More than a month ago                       [facility] Adult PT Treatment/Exercise - 12/24/18 0001      Exercises   Exercises  Knee/Hip      Knee/Hip Exercises: Stretches   Active Hamstring Stretch  2 reps;30 seconds;Both    Piriformis Stretch  2 reps;30 seconds;Both    Piriformis Stretch Limitations  seated fig 4    Other Knee/Hip Stretches  DKTC 30 sec x3; LTR x10;       Knee/Hip Exercises: Aerobic   Stationary Bike  L1 x8 min;       Knee/Hip Exercises: Supine   Bridges  10 reps    Straight  Leg Raises  10 reps;Both    Other Supine Knee/Hip Exercises  Clam GTB x20      Knee/Hip Exercises: Sidelying   Hip ABduction  10 reps;Both             PT Education - 12/24/18 0847    Education Details  Initial HEP for LEs and stretching, will add UE, posture, and core in future visits.    Methods  Explanation;Demonstration;Handout    Comprehension  Verbalized understanding;Returned demonstration       PT Short Term Goals - 12/22/18 1513      PT SHORT TERM GOAL #1   Title  Pt to be independent with initial HEP    Time  2    Period  Weeks    Status  New    Target Date  01/01/19        PT Long Term Goals - 12/22/18 1513      PT LONG TERM  GOAL #1   Title  Pt to demo improved strength of LEs to at least 4+/5 to improve stability and pain.    Time  6    Period  Weeks    Status  New    Target Date  01/29/19      PT LONG TERM GOAL #2   Title  Pt to verbalize and/or obtain proper footwear for her foot type, to improve pain    Time  6    Period  Weeks    Status  New    Target Date  01/29/19      PT LONG TERM GOAL #3   Title  Pt to report compliance with exercise program at least 3 days/week, for improved LE/UE and core strength.    Time  6    Period  Weeks    Status  New    Target Date  01/29/19      PT LONG TERM GOAL #4   Title  Pt to demo ability for optimal seated posture, and correct mechanics with bending, lifting, and IADLS.    Time  6    Period  Weeks    Status  New    Target Date  01/29/19            Plan - 12/24/18 0936    Clinical Impression Statement  Pt educated on ther ex for stretching and start of LE strength today. Will continue education on HEP for UE, core, and posture in future visits. Discussed activity modification for L thumb    Personal Factors and Comorbidities  Comorbidity 1    Comorbidities  Pain in multiple joints.    Examination-Activity Limitations  Locomotion Level;Squat;Lift;Stand;Bend    Examination-Participation  Restrictions  Cleaning;Community Activity;Laundry    Stability/Clinical Decision Making  Stable/Uncomplicated    Rehab Potential  Good    PT Frequency  2x / week    PT Duration  6 weeks    PT Treatment/Interventions  ADLs/Self Care Home Management;Cryotherapy;Electrical Stimulation;DME Instruction;Ultrasound;Traction;Moist Heat;Iontophoresis 4mg /ml Dexamethasone;Gait training;Stair training;Functional mobility training;Therapeutic activities;Therapeutic exercise;Balance training;Neuromuscular re-education;Manual techniques;Orthotic Fit/Training;Patient/family education;Passive range of motion;Dry needling;Taping;Spinal Manipulations;Joint Manipulations    Consulted and Agree with Plan of Care  Patient       Patient will benefit from skilled therapeutic intervention in order to improve the following deficits and impairments:  Decreased range of motion, Difficulty walking, Decreased safety awareness, Decreased endurance, Decreased activity tolerance, Pain, Improper body mechanics, Impaired flexibility, Decreased mobility, Decreased strength  Visit Diagnosis: Chronic bilateral low back pain without sciatica  Muscle weakness (generalized)     Problem List Patient Active Problem List   Diagnosis Date Noted  . Hypertensive disorder 04/13/2016  . Migraine 04/13/2016  . Ankylosing spondylitis ([place]) 04/13/2016    Lyndee Hensen, PT, DPT 9:40 AM  12/24/18    [facility] [place], [place], [address] Phone: [phone]   Fax:  [phone]  Name: Alison Mahoney MRN: [id number] Date of Birth: [birth year]

## 2018-12-25 ENCOUNTER — Ambulatory Visit: Payer: BC Managed Care – PPO | Admitting: Physical Therapy

## 2019-01-02 ENCOUNTER — Ambulatory Visit: Payer: BC Managed Care – PPO | Admitting: Physical Therapy

## 2019-01-06 ENCOUNTER — Encounter: Payer: BC Managed Care – PPO | Admitting: Physical Therapy

## 2019-01-07 ENCOUNTER — Encounter: Payer: BC Managed Care – PPO | Admitting: Physical Therapy

## 2019-01-12 ENCOUNTER — Ambulatory Visit (INDEPENDENT_AMBULATORY_CARE_PROVIDER_SITE_OTHER): Payer: BC Managed Care – PPO | Admitting: Physical Therapy

## 2019-01-12 DIAGNOSIS — G8929 Other chronic pain: Secondary | ICD-10-CM

## 2019-01-12 DIAGNOSIS — M545 Low back pain, unspecified: Secondary | ICD-10-CM

## 2019-01-12 DIAGNOSIS — M6281 Muscle weakness (generalized): Secondary | ICD-10-CM | POA: Diagnosis not present

## 2019-01-13 ENCOUNTER — Encounter: Payer: Self-pay | Admitting: Physical Therapy

## 2019-01-13 NOTE — Therapy (Signed)
[facility] [address] [place], [place], [address] Phone: [phone]   Fax:  [phone]  Physical Therapy Treatment  Patient Details  Name: Alison Mahoney MRN: [id number] Date of Birth: October 26, 1960 Referring Provider (PT): [name]   Encounter Date: 01/12/2019  PT End of Session - 01/13/19 0913    Visit Number  3    Number of Visits  12    Date for PT Re-Evaluation  02/02/19    Authorization Type  BCBS    PT Start Time  0804    PT Stop Time  0846    PT Time Calculation (min)  42 min    Activity Tolerance  Patient tolerated treatment well    Behavior During Therapy  [facility] for tasks assessed/performed       Past Medical History:  Diagnosis Date  . Ankylosing spondylitis ([place])    35 years ago  . Hyperlipidemia   . Hypertension   . Migraines     Past Surgical History:  Procedure Laterality Date  . ABDOMINAL HYSTERECTOMY  1988  . lt hand surgery  1985  . Lt middle toe surgery  1977  . Rt thumb joint replacement  1982  . vaginal delivery x3  1979, 1982, 1986    There were no vitals filed for this visit.  Subjective Assessment - 01/13/19 0913    Subjective  Pt requests tele health visit today. She states not doing much of HEP since last visit, has questions on some of the exercises.         Physical Therapy Telehealth Visit:  I connected with Alison Mahoney today at 8:04 am by [email or web address] video conference and verified that I am speaking with the correct person using two identifiers.  I discussed the limitations, risks, security and privacy concerns of performing an evaluation and management service by Webex and the availability of in person appointments.  I also discussed with the patient that there may be a patient responsible charge related to this service. The patient expressed understanding and agreed to proceed.    The patient's address was confirmed.  Identified to the patient that therapist is a licensed PT in the  state of [place].  Verified phone [phone] to call in case of technical difficulties.                [facility] Adult PT Treatment/Exercise - 01/13/19 0001      Exercises   Exercises  Knee/Hip      Knee/Hip Exercises: Stretches   Active Hamstring Stretch  --    Piriformis Stretch  2 reps;30 seconds;Both    Piriformis Stretch Limitations  seated fig 4    Other Knee/Hip Stretches  DKTC 30 sec x3; LTR x10;       Knee/Hip Exercises: Aerobic   Stationary Bike  --      Knee/Hip Exercises: Standing   Other Standing Knee Exercises  Rows RTB x20; Bicep curl RTB x10 bil;       Knee/Hip Exercises: Supine   Bridges  15 reps    Straight Leg Raises  10 reps;Both    Other Supine Knee/Hip Exercises  Clam GTB x20      Knee/Hip Exercises: Sidelying   Hip ABduction  10 reps;Both               PT Short Term Goals - 12/22/18 1513      PT SHORT TERM GOAL #1   Title  Pt to be independent with initial HEP  Time  2    Period  Weeks    Status  New    Target Date  01/01/19        PT Long Term Goals - 12/22/18 1513      PT LONG TERM GOAL #1   Title  Pt to demo improved strength of LEs to at least 4+/5 to improve stability and pain.    Time  6    Period  Weeks    Status  New    Target Date  01/29/19      PT LONG TERM GOAL #2   Title  Pt to verbalize and/or obtain proper footwear for her foot type, to improve pain    Time  6    Period  Weeks    Status  New    Target Date  01/29/19      PT LONG TERM GOAL #3   Title  Pt to report compliance with exercise program at least 3 days/week, for improved LE/UE and core strength.    Time  6    Period  Weeks    Status  New    Target Date  01/29/19      PT LONG TERM GOAL #4   Title  Pt to demo ability for optimal seated posture, and correct mechanics with bending, lifting, and IADLS.    Time  6    Period  Weeks    Status  New    Target Date  01/29/19            Plan - 01/13/19 0914    Clinical Impression  Statement  Reviewed/performed all exercises for HEP today. Pt questions on form and mechanics answered. Added UE strengthening as well. Pt to benefit from continued care.    Personal Factors and Comorbidities  Comorbidity 1    Comorbidities  Pain in multiple joints.    Examination-Activity Limitations  Locomotion Level;Squat;Lift;Stand;Bend    Examination-Participation Restrictions  Cleaning;Community Activity;Laundry    Stability/Clinical Decision Making  Stable/Uncomplicated    Rehab Potential  Good    PT Frequency  2x / week    PT Duration  6 weeks    PT Treatment/Interventions  ADLs/Self Care Home Management;Cryotherapy;Electrical Stimulation;DME Instruction;Ultrasound;Traction;Moist Heat;Iontophoresis 4mg /ml Dexamethasone;Gait training;Stair training;Functional mobility training;Therapeutic activities;Therapeutic exercise;Balance training;Neuromuscular re-education;Manual techniques;Orthotic Fit/Training;Patient/family education;Passive range of motion;Dry needling;Taping;Spinal Manipulations;Joint Manipulations    Consulted and Agree with Plan of Care  Patient       Patient will benefit from skilled therapeutic intervention in order to improve the following deficits and impairments:  Decreased range of motion, Difficulty walking, Decreased safety awareness, Decreased endurance, Decreased activity tolerance, Pain, Improper body mechanics, Impaired flexibility, Decreased mobility, Decreased strength  Visit Diagnosis: Chronic bilateral low back pain without sciatica  Muscle weakness (generalized)     Problem List Patient Active Problem List   Diagnosis Date Noted  . Hypertensive disorder 04/13/2016  . Migraine 04/13/2016  . Ankylosing spondylitis ([place]) 04/13/2016    Lyndee Hensen, PT, DPT 9:17 AM  01/13/19    [facility] [place], [place], [address] Phone: [phone]   Fax:  [phone]  Name: Alison Mahoney MRN:  [id number] Date of Birth: 02/07/1961
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
## 2019-01-21 ENCOUNTER — Other Ambulatory Visit: Payer: Self-pay

## 2019-01-21 ENCOUNTER — Encounter: Payer: Self-pay | Admitting: Rheumatology

## 2019-01-21 ENCOUNTER — Telehealth (INDEPENDENT_AMBULATORY_CARE_PROVIDER_SITE_OTHER): Payer: BC Managed Care – PPO | Admitting: Rheumatology

## 2019-01-21 DIAGNOSIS — M5030 Other cervical disc degeneration, unspecified cervical region: Secondary | ICD-10-CM | POA: Diagnosis not present

## 2019-01-21 DIAGNOSIS — Z8669 Personal history of other diseases of the nervous system and sense organs: Secondary | ICD-10-CM

## 2019-01-21 DIAGNOSIS — Z8269 Family history of other diseases of the musculoskeletal system and connective tissue: Secondary | ICD-10-CM

## 2019-01-21 DIAGNOSIS — M533 Sacrococcygeal disorders, not elsewhere classified: Secondary | ICD-10-CM

## 2019-01-21 DIAGNOSIS — R5383 Other fatigue: Secondary | ICD-10-CM

## 2019-01-21 DIAGNOSIS — M79641 Pain in right hand: Secondary | ICD-10-CM

## 2019-01-21 DIAGNOSIS — M79671 Pain in right foot: Secondary | ICD-10-CM

## 2019-01-21 DIAGNOSIS — M79642 Pain in left hand: Secondary | ICD-10-CM

## 2019-01-21 DIAGNOSIS — M79672 Pain in left foot: Secondary | ICD-10-CM

## 2019-01-21 DIAGNOSIS — G8929 Other chronic pain: Secondary | ICD-10-CM

## 2019-01-21 DIAGNOSIS — M545 Low back pain: Secondary | ICD-10-CM

## 2019-01-21 DIAGNOSIS — M450 Ankylosing spondylitis of multiple sites in spine: Secondary | ICD-10-CM | POA: Diagnosis not present

## 2019-01-21 DIAGNOSIS — I10 Essential (primary) hypertension: Secondary | ICD-10-CM

## 2019-01-27 ENCOUNTER — Other Ambulatory Visit: Payer: Self-pay

## 2019-01-27 ENCOUNTER — Ambulatory Visit (INDEPENDENT_AMBULATORY_CARE_PROVIDER_SITE_OTHER): Payer: BC Managed Care – PPO | Admitting: Physical Therapy

## 2019-01-27 DIAGNOSIS — M545 Low back pain, unspecified: Secondary | ICD-10-CM

## 2019-01-27 DIAGNOSIS — M6281 Muscle weakness (generalized): Secondary | ICD-10-CM | POA: Diagnosis not present

## 2019-01-27 DIAGNOSIS — G8929 Other chronic pain: Secondary | ICD-10-CM | POA: Diagnosis not present

## 2019-01-28 ENCOUNTER — Encounter: Payer: Self-pay | Admitting: Physical Therapy

## 2019-01-28 NOTE — Patient Instructions (Signed)
Access Code: [email or web address]  URL: [email or web address]  Date: 01/13/2019  Prepared by: Lyndee Hensen   Exercises Seated Hamstring Stretch - 3 reps - 30 hold - 2x daily Seated Piriformis Stretch with Trunk Bend - 3 reps - 30 hold - 2x daily Supine Double Knee to Chest - 3 reps - 30 hold - 2x daily Supine Lower Trunk Rotation - 10 reps - 10 hold - 2x daily Straight Leg Raise - 10 reps - 2 sets - 1x daily Sidelying Hip Abduction - 10 reps - 2 sets - 1x daily Supine Bridge - 10 reps - 2 sets - 1x daily Hooklying Isometric Clamshell - 10 reps - 2 sets - 1x daily Scapular Retraction with Resistance - 10 reps - 2 sets - 1x daily Standing Bicep Curls Supinated with Dumbbells - 10 reps - 2 sets                            - 1x daily

## 2019-01-28 NOTE — Therapy (Signed)
[facility] [address] [place], [place], [address] Phone: [phone]   Fax:  [phone]  Physical Therapy Treatment/Discharge  Patient Details  Name: Alison Mahoney MRN: [id number] Date of Birth: July 20, 1960 Referring Provider (PT): [name]   Encounter Date: 01/27/2019   Physical Therapy Telehealth Visit:  I connected with Alison Mahoney today at 8:12 am by [email or web address] video conference and verified that I am speaking with the correct person using two identifiers.  I discussed the limitations, risks, security and privacy concerns of performing an evaluation and management service by Webex and the availability of in person appointments.  I also discussed with the patient that there may be a patient responsible charge related to this service. The patient expressed understanding and agreed to proceed.    The patient's address was confirmed.  Identified to the patient that therapist is a licensed PT  in the state of [place].  Verified phone # [phone]  as  to call in case of technical difficulties.       PT End of Session - 01/28/19 1056    Visit Number  4    Number of Visits  12    Date for PT Re-Evaluation  02/02/19    Authorization Type  BCBS    PT Start Time  0812    PT Stop Time  0842    PT Time Calculation (min)  30 min    Activity Tolerance  Patient tolerated treatment well    Behavior During Therapy  [facility] for tasks assessed/performed       Past Medical History:  Diagnosis Date  . Ankylosing spondylitis ([place])    35 years ago  . Hyperlipidemia   . Hypertension   . Migraines     Past Surgical History:  Procedure Laterality Date  . ABDOMINAL HYSTERECTOMY  1988  . lt hand surgery  1985  . Lt middle toe surgery  1977  . Rt thumb joint replacement  1982  . vaginal delivery x3  1979, 1982, 1986    There were no vitals filed for this visit.  Subjective Assessment - 01/28/19 1049    Subjective  Telehealth visit  today. Pt states she is still working on doing full HEP routinely. She is challenged with LE and core exercises. States low back pain is variable.    Patient Stated Goals  Decreased pain, Start exercising consistently.    Currently in Pain?  Yes    Pain Location  Back    Pain Orientation  Left    Pain Descriptors / Indicators  Aching    Pain Type  Chronic pain    Pain Onset  More than a month ago    Pain Frequency  Intermittent                       [facility] Adult PT Treatment/Exercise - 01/28/19 0001      Self-Care   Self-Care  Other Self-Care Comments    Other Self-Care Comments   posture, exercise progression, recommendations for exercise.       Knee/Hip Exercises: Standing   Other Standing Knee Exercises  Rows RTB x20; Bicep curl RTB x10 bil;       Knee/Hip Exercises: Supine   Bridges  20 reps    Straight Leg Raises  10 reps    Other Supine Knee/Hip Exercises  Clam GTB x20      Knee/Hip Exercises: Sidelying   Hip ABduction  10 reps;Both  PT Education - 01/28/19 1056    Education Details  Reviewed HEP    Person(s) Educated  Patient    Methods  Explanation;Demonstration;Handout    Comprehension  Verbalized understanding       PT Short Term Goals - 01/28/19 1056      PT SHORT TERM GOAL #1   Title  Pt to be independent with initial HEP    Time  2    Period  Weeks    Status  Achieved    Target Date  01/01/19        PT Long Term Goals - 01/28/19 1056      PT LONG TERM GOAL #1   Title  Pt to demo improved strength of LEs to at least 4+/5 to improve stability and pain.    Time  6    Period  Weeks    Status  Unable to assess      PT LONG TERM GOAL #2   Title  Pt to verbalize and/or obtain proper footwear for her foot type, to improve pain    Time  6    Period  Weeks    Status  Unable to assess      PT LONG TERM GOAL #3   Title  Pt to report compliance with exercise program at least 3 days/week, for improved LE/UE and core  strength.    Time  6    Period  Weeks    Status  Partially Met      PT LONG TERM GOAL #4   Title  Pt to demo ability for optimal seated posture, and correct mechanics with bending, lifting, and IADLS.    Time  6    Period  Weeks    Status  Achieved            Plan - 01/28/19 1058    Clinical Impression Statement  Pt seen for telehealth visit today. Reviewed ther ex for HEP, Pt able to demo correct mechanics for exercises discussed. Education done for plan, progression of exercise, and recommendations for gym, pilates, and personal training in future. Pt would like to continue HEP for a while consistenly, and see how she does. May require skilled PT in future, when pt wants to return to clinic in person.    Personal Factors and Comorbidities  Comorbidity 1    Comorbidities  Pain in multiple joints.    Examination-Activity Limitations  Locomotion Level;Squat;Lift;Stand;Bend    Examination-Participation Restrictions  Cleaning;Community Activity;Laundry    Stability/Clinical Decision Making  Stable/Uncomplicated    Rehab Potential  Good    PT Frequency  2x / week    PT Duration  6 weeks    PT Treatment/Interventions  ADLs/Self Care Home Management;Cryotherapy;Electrical Stimulation;DME Instruction;Ultrasound;Traction;Moist Heat;Iontophoresis 30m/ml Dexamethasone;Gait training;Stair training;Functional mobility training;Therapeutic activities;Therapeutic exercise;Balance training;Neuromuscular re-education;Manual techniques;Orthotic Fit/Training;Patient/family education;Passive range of motion;Dry needling;Taping;Spinal Manipulations;Joint Manipulations    Consulted and Agree with Plan of Care  Patient       Patient will benefit from skilled therapeutic intervention in order to improve the following deficits and impairments:  Decreased range of motion, Difficulty walking, Decreased safety awareness, Decreased endurance, Decreased activity tolerance, Pain, Improper body mechanics, Impaired  flexibility, Decreased mobility, Decreased strength  Visit Diagnosis: Chronic bilateral low back pain without sciatica  Muscle weakness (generalized)     Problem List Patient Active Problem List   Diagnosis Date Noted  . Hypertensive disorder 04/13/2016  . Migraine 04/13/2016  . [place] 04/13/2016     Alison Mahoney  Alison Mahoney, PT, DPT 11:05 AM  01/28/19   [facility] [place], [place], [address] Phone: [phone]   Fax:  [phone]  Name: Alison Mahoney MRN: [id number] Date of Birth: [birth year]   PHYSICAL THERAPY DISCHARGE SUMMARY  Visits from Start of Care:4  Plan: Patient agrees to discharge.  Patient goals were partially met. Patient is being discharged due to being pleased with the current functional level.  ?????      Lyndee Hensen, PT, DPT 11:05 AM  01/28/19

## 2019-02-20 ENCOUNTER — Ambulatory Visit: Payer: BC Managed Care – PPO | Admitting: Physical Therapy

## 2019-03-05 ENCOUNTER — Ambulatory Visit: Payer: BC Managed Care – PPO | Admitting: Rheumatology

## 2019-03-27 ENCOUNTER — Ambulatory Visit: Payer: BC Managed Care – PPO | Attending: Internal Medicine

## 2019-03-27 DIAGNOSIS — Z23 Encounter for immunization: Secondary | ICD-10-CM | POA: Insufficient documentation

## 2019-03-27 NOTE — Progress Notes (Signed)
   Covid-19 Vaccination Clinic  Name:  JADAISHA BREEZE    MRN: [id number] DOB: 02-02-1961  03/27/2019  Ms. Baab was observed post Covid-19 immunization for 15 minutes without incidence. She was provided with Vaccine Information Sheet and instruction to access the V-Safe system.   Ms. Dibiasio was instructed to call 911 with any severe reactions post vaccine: Marland Kitchen Difficulty breathing  . Swelling of your face and throat  . A fast heartbeat  . A bad rash all over your body  . Dizziness and weakness    Immunizations Administered    Name Date Dose VIS Date Route   Pfizer COVID-19 Vaccine 03/27/2019 11:59 AM 0.3 mL 01/23/2019 Intramuscular   Manufacturer: [place]   Lot: [id number]   [place]: [id number]

## 2019-04-18 ENCOUNTER — Ambulatory Visit: Payer: BC Managed Care – PPO | Attending: Internal Medicine

## 2019-04-18 DIAGNOSIS — Z23 Encounter for immunization: Secondary | ICD-10-CM | POA: Insufficient documentation

## 2019-04-18 NOTE — Progress Notes (Signed)
   Covid-19 Vaccination Clinic  Name:  Alison Mahoney    MRN: [id number] DOB: 1960-08-27  04/18/2019  Ms. Ostermeyer was observed post Covid-19 immunization for 15 minutes without incident. She was provided with Vaccine Information Sheet and instruction to access the V-Safe system.   Ms. Huyler was instructed to call 911 with any severe reactions post vaccine: Marland Kitchen Difficulty breathing  . Swelling of face and throat  . A fast heartbeat  . A bad rash all over body  . Dizziness and weakness   Immunizations Administered    Name Date Dose VIS Date Route   Pfizer COVID-19 Vaccine 04/18/2019  3:22 PM 0.3 mL 01/23/2019 Intramuscular   Manufacturer: [place]   Lot: [id number]   [place]: [id number]

## 2019-04-24 ENCOUNTER — Ambulatory Visit (INDEPENDENT_AMBULATORY_CARE_PROVIDER_SITE_OTHER): Payer: BC Managed Care – PPO | Admitting: Physician Assistant

## 2019-04-24 ENCOUNTER — Other Ambulatory Visit: Payer: Self-pay

## 2019-04-24 ENCOUNTER — Encounter: Payer: Self-pay | Admitting: Physician Assistant

## 2019-04-24 DIAGNOSIS — M542 Cervicalgia: Secondary | ICD-10-CM | POA: Diagnosis not present

## 2019-04-24 MED ORDER — CYCLOBENZAPRINE HCL 5 MG PO TABS: 5.0000 mg | ORAL_TABLET | Freq: Every evening | ORAL | 0 refills | Status: DC | PRN

## 2019-04-24 MED ORDER — DICLOFENAC SODIUM 75 MG PO TBEC: 75.0000 mg | DELAYED_RELEASE_TABLET | Freq: Two times a day (BID) | ORAL | 0 refills | Status: DC

## 2019-04-24 NOTE — Progress Notes (Signed)
Virtual Visit via Video   I connected with Alison Mahoney on 04/24/19 at 12:30 PM EST by a video enabled telemedicine application and verified that I am speaking with the correct person using two identifiers. Location patient: Home Location provider: [facility], Office Persons participating in the virtual visit: Alvia, [place] PA-C, Anselmo Pickler, LPN   I discussed the limitations of evaluation and management by telemedicine and the availability of in person appointments. The patient expressed understanding and agreed to proceed.  I acted as a Education administrator for [employer], [employer], LPN  Subjective:   HPI:   Neck pain Pt c/o pain left side of neck x 5 days. She has required use of computers for online for work more recently than usual and feels as though her ergonomics have caused her to have poor posture in this flare.  She has tried Aleve in the morning and Tylenol twice a day. Heating pad with little relief. She has tried Therma care and warm showers.  Denies: Vision changes, numbness or tingling, nausea, bony pain   ROS: See pertinent positives and negatives per HPI.  Patient Active Problem List   Diagnosis Date Noted  . Hypertensive disorder 04/13/2016  . Migraine 04/13/2016  . Ankylosing spondylitis ([place]) 04/13/2016    Social History   Tobacco Use  . Smoking status: Never Smoker  . Smokeless tobacco: Never Used  Substance Use Topics  . Alcohol use: Not Currently    Current Outpatient Medications:  .  Adapalene 0.3 % gel, APPLY TO FACE AT BEDTIME, Disp: , Rfl:  .  b complex vitamins tablet, Take 1 tablet by mouth daily., Disp: , Rfl:  .  co-enzyme Q-10 30 MG capsule, Take 30 mg by mouth daily., Disp: , Rfl:  .  hydrochlorothiazide (HYDRODIURIL) 12.5 MG tablet, Take 1 tablet (12.5 mg total) by mouth daily., Disp: 90 tablet, Rfl: 1 .  losartan (COZAAR) 100 MG tablet, Take 1 tablet (100 mg total) by mouth daily., Disp: 90 tablet, Rfl: 1 .   naproxen sodium (ALEVE) 220 MG tablet, Take 220 mg by mouth as needed., Disp: , Rfl:  .  OVER THE COUNTER MEDICATION, Take 2 tablets by mouth daily. Cardio Tonic BP, Disp: , Rfl:  .  SUMAtriptan (IMITREX) 100 MG tablet, Take 1 tablet (100 mg total) by mouth every 2 (two) hours as needed for migraine. As needed for Migraines, Disp: 10 tablet, Rfl: 3 .  cyclobenzaprine (FLEXERIL) 5 MG tablet, Take 1 tablet (5 mg total) by mouth at bedtime as needed for muscle spasms., Disp: 20 tablet, Rfl: 0 .  diclofenac (VOLTAREN) 75 MG EC tablet, Take 1 tablet (75 mg total) by mouth 2 (two) times daily., Disp: 30 tablet, Rfl: 0  Allergies  Allergen Reactions  . Inderal [Propranolol] Rash    Objective:   VITALS: Per patient if applicable, see vitals. GENERAL: Alert, appears well and in no acute distress. HEENT: Atraumatic, conjunctiva clear, no obvious abnormalities on inspection of external nose and ears. NECK: Slight decreased ROM with external rotation to the L; able to freely flex and extend her neck CARDIOPULMONARY: No increased WOB. Speaking in clear sentences. I:E ratio WNL.  MS: Moves all visible extremities without noticeable abnormality. PSYCH: Pleasant and cooperative, well-groomed. Speech normal rate and rhythm. Affect is appropriate. Insight and judgement are appropriate. Attention is focused, linear, and appropriate.  NEURO: CN grossly intact. Oriented as arrived to appointment on time with no prompting. Moves both UE equally.  SKIN: No obvious lesions, wounds, erythema, or cyanosis noted on face or hands.  Assessment and Plan:   Aili was seen today for neck pain.  Diagnoses and all orders for this visit:  Neck pain  Other orders -     diclofenac (VOLTAREN) 75 MG EC tablet; Take 1 tablet (75 mg total) by mouth 2 (two) times daily. -     cyclobenzaprine (FLEXERIL) 5 MG tablet; Take 1 tablet (5 mg total) by mouth at bedtime as needed for muscle spasms.   Suspect muscle strain due to  poor posture and recent increase in computer use. No red flags on discussion.  Will trial diclofenac, to replace her Aleve.  Recommended taking anti-inflammatories only for duration that is needed, given her history of HTN. We will also add as needed Flexeril at bedtime.  Sleepy precautions advised.  We briefly considered doing oral prednisone however she has an appointment with rheumatology on Wednesday and wants to make sure that this would not mask any of her issues.  Worsening precautions advised.  Gentle stretches recommended.  . Reviewed expectations re: course of current medical issues. . Discussed self-management of symptoms. . Outlined signs and symptoms indicating need for more acute intervention. . Patient verbalized understanding and all questions were answered. Marland Kitchen Health Maintenance issues including appropriate healthy diet, exercise, and smoking avoidance were discussed with patient. . See orders for this visit as documented in the electronic medical record.  I discussed the assessment and treatment plan with the patient. The patient was provided an opportunity to ask questions and all were answered. The patient agreed with the plan and demonstrated an understanding of the instructions.   The patient was advised to call back or seek an in-person evaluation if the symptoms worsen or if the condition fails to improve as anticipated.   CMA or LPN served as scribe during this visit. History, Physical, and Plan performed by medical provider. The above documentation has been reviewed and is accurate and complete.  [place], [place] 04/24/2019

## 2019-04-24 NOTE — Progress Notes (Deleted)
Office Visit Note  Patient: Alison Mahoney             Date of Birth: 11-Aug-1960           MRN: [id number]             PCP: [name], PA Referring: [name], PA Visit Date: 04/29/2019 Occupation: @GUAROCC @  Subjective:  No chief complaint on file.   History of Present Illness: Alison Mahoney is a 59 y.o. female ***   Activities of Daily Living:  Patient reports morning stiffness for *** {minute/hour:19697}.   Patient {ACTIONS;DENIES/REPORTS:21021675::"Denies"} nocturnal pain.  Difficulty dressing/grooming: {ACTIONS;DENIES/REPORTS:21021675::"Denies"} Difficulty climbing stairs: {ACTIONS;DENIES/REPORTS:21021675::"Denies"} Difficulty getting out of chair: {ACTIONS;DENIES/REPORTS:21021675::"Denies"} Difficulty using hands for taps, buttons, cutlery, and/or writing: {ACTIONS;DENIES/REPORTS:21021675::"Denies"}  No Rheumatology ROS completed.   PMFS History:  Patient Active Problem List   Diagnosis Date Noted  . Hypertensive disorder 04/13/2016  . Migraine 04/13/2016  . Ankylosing spondylitis ([place]) 04/13/2016    Past Medical History:  Diagnosis Date  . Ankylosing spondylitis ([place])    35 years ago  . Hyperlipidemia   . Hypertension   . Migraines     Family History  Problem Relation Age of Onset  . Arthritis Mother   . Arthritis Father   . Cancer Maternal Grandmother   . Cancer Paternal Grandmother   . Cancer Paternal Grandfather   . Diabetes Paternal Grandfather    Past Surgical History:  Procedure Laterality Date  . ABDOMINAL HYSTERECTOMY  1988  . lt hand surgery  1985  . Lt middle toe surgery  1977  . Rt thumb joint replacement  1982  . vaginal delivery x3  1979, 1982, 1986   Social History   Social History Narrative   Moved from [place] in [place]; 3 children   Currently works as a Animator History  Administered Date(s) Administered  . Influenza Inj Mdck Quad Pf 11/17/2017  . Influenza,inj,Quad PF,6+ Mos  12/12/2016, 12/03/2018  . Influenza-Unspecified 11/13/2017  . PFIZER SARS-COV-2 Vaccination 03/27/2019, 04/18/2019  . Tdap 12/03/2018     Objective: Vital Signs: There were no vitals taken for this visit.   Physical Exam   Musculoskeletal Exam: ***  CDAI Exam: CDAI Score: -- Patient Global: --; Provider Global: -- Swollen: --; Tender: -- Joint Exam 04/29/2019   No joint exam has been documented for this visit   There is currently no information documented on the homunculus. Go to the Rheumatology activity and complete the homunculus joint exam.  Investigation: No additional findings.  Imaging: No results found.  Recent Labs: Lab Results  Component Value Date   WBC 5.2 12/03/2018   HGB 15.4 (H) 12/03/2018   PLT 227.0 12/03/2018   NA 140 12/03/2018   K 4.7 12/03/2018   CL 103 12/03/2018   CO2 30 12/03/2018   GLUCOSE 107 (H) 12/03/2018   BUN 11 12/03/2018   CREATININE 0.70 12/03/2018   BILITOT 0.7 12/03/2018   ALKPHOS 77 12/03/2018   AST 19 12/03/2018   ALT 11 12/03/2018   PROT 6.8 12/03/2018   ALBUMIN 4.6 12/03/2018   CALCIUM 9.6 12/03/2018    Speciality Comments: No specialty comments available.  Procedures:  No procedures performed Allergies: Inderal [propranolol]   Assessment / Plan:     Visit Diagnoses: No diagnosis found.  Orders: No orders of the defined types were placed in this encounter.  No orders of the defined types were placed in this encounter.   Face-to-face time spent with  patient was *** minutes. Greater than 50% of time was spent in counseling and coordination of care.  Follow-Up Instructions: No follow-ups on file.   Bo Merino, MD  Note - This record has been created using Editor, commissioning.  Chart creation errors have been sought, but may not always  have been located. Such creation errors do not reflect on  the standard of medical care.

## 2019-04-27 NOTE — Progress Notes (Signed)
Office Visit Note  Patient: Alison Mahoney             Date of Birth: 01/10/1961           MRN: [id number]             PCP: [name], PA Referring: [name], PA Visit Date: 05/05/2019 Occupation: '@GUAROCC'$ @  Subjective:  Pain in multiple joints.   History of Present Illness: Alison Mahoney is a 59 y.o. female who claims that she was diagnosed with ankylosing spondylitis several years ago.  She was treated with anti-inflammatories in the past.  She states she continues to have a lot of discomfort in her cervical spine, lumbar spine and her SI joints.  She states she has been having pain and discomfort in her bilateral hands and bilateral feet.  She has difficulty making a fist at times.  Activities of Daily Living:  Patient reports morning stiffness for 5-10 minutes.   Patient Reports nocturnal pain.  Difficulty dressing/grooming: Denies Difficulty climbing stairs: Denies Difficulty getting out of chair: Denies Difficulty using hands for taps, buttons, cutlery, and/or writing: Reports  Review of Systems  Constitutional: Positive for fatigue. Negative for night sweats, weight gain and weight loss.  HENT: Positive for nose dryness. Negative for mouth sores, trouble swallowing, trouble swallowing and mouth dryness.   Eyes: Positive for dryness. Negative for pain, redness and visual disturbance.  Respiratory: Negative for cough, shortness of breath, wheezing and difficulty breathing.   Cardiovascular: Negative for chest pain, palpitations, hypertension, irregular heartbeat and swelling in legs/feet.  Gastrointestinal: Negative for blood in stool, constipation and diarrhea.  Endocrine: Negative for increased urination.  Genitourinary: Negative for difficulty urinating, painful urination and vaginal dryness.  Musculoskeletal: Positive for arthralgias, joint pain, joint swelling and morning stiffness. Negative for myalgias, muscle weakness, muscle tenderness and myalgias.  Skin:  Negative for color change, rash, hair loss, skin tightness, ulcers and sensitivity to sunlight.  Allergic/Immunologic: Negative for susceptible to infections.  Neurological: Positive for headaches and weakness. Negative for dizziness, numbness, memory loss and night sweats.  Hematological: Negative for bruising/bleeding tendency and swollen glands.  Psychiatric/Behavioral: Negative for depressed mood, confusion and sleep disturbance. The patient is not nervous/anxious.     PMFS History:  Patient Active Problem List   Diagnosis Date Noted  . Hypertensive disorder 04/13/2016  . Migraine 04/13/2016  . Ankylosing spondylitis ([place]) 04/13/2016    Past Medical History:  Diagnosis Date  . Ankylosing spondylitis ([place])    35 years ago  . Hyperlipidemia   . Hypertension   . Migraines     Family History  Problem Relation Age of Onset  . Arthritis Mother   . Arthritis Father   . Diabetes Brother   . Healthy Daughter   . Healthy Son   . Cancer Maternal Grandmother   . Cancer Paternal Grandmother   . Cancer Paternal Grandfather   . Diabetes Paternal Grandfather   . Healthy Son    Past Surgical History:  Procedure Laterality Date  . ABDOMINAL HYSTERECTOMY  1988  . lt hand surgery  1985  . Lt middle toe surgery  1977  . Rt thumb joint replacement  1982  . vaginal delivery x3  1979, 1982, 1986   Social History   Social History Narrative   Moved from [place] in [place]; 3 children   Currently works as a Animator History  Administered Date(s) Administered  . Influenza Inj Mdck Quad Pf  11/17/2017  . Influenza,inj,Quad PF,6+ Mos 12/12/2016, 12/03/2018  . Influenza-Unspecified 11/13/2017  . PFIZER SARS-COV-2 Vaccination 03/27/2019, 04/18/2019  . Tdap 12/03/2018     Objective: Vital Signs: BP (!) 133/94 (BP Location: Left Arm, Patient Position: Sitting, Cuff Size: Normal)   Pulse 86   Resp 13   Ht _0  (1.626 m)   Wt 193 lb 12.8 oz (87.9  kg)   BMI 33.27 kg/m    Physical Exam Vitals and nursing note reviewed.  Constitutional:      Appearance: She is well-developed.  HENT:     Head: Normocephalic and atraumatic.  Eyes:     Conjunctiva/sclera: Conjunctivae normal.  Cardiovascular:     Rate and Rhythm: Normal rate and regular rhythm.     Heart sounds: Normal heart sounds.  Pulmonary:     Effort: Pulmonary effort is normal.     Breath sounds: Normal breath sounds.  Abdominal:     General: Bowel sounds are normal.     Palpations: Abdomen is soft.  Musculoskeletal:     Cervical back: Normal range of motion.  Lymphadenopathy:     Cervical: No cervical adenopathy.  Skin:    General: Skin is warm and dry.     Capillary Refill: Capillary refill takes less than 2 seconds.  Neurological:     Mental Status: She is alert and oriented to person, place, and time.  Psychiatric:        Behavior: Behavior normal.      Musculoskeletal Exam: C-spine she has some stiffness with range of motion.  Thoracic and lumbar spine were in good range of motion.  Schober's was negative.  She had no SI joint tenderness.  Shoulder joints, elbow joints, wrist joints, MCPs and PIPs and DIPs with good range of motion with no synovitis.  Hip joints, knee joints, ankles, MTPs and PIPs with good range of motion with no synovitis.  CDAI Exam: CDAI Score: -- Patient Global: --; Provider Global: -- Swollen: --; Tender: -- Joint Exam 05/05/2019   No joint exam has been documented for this visit   There is currently no information documented on the homunculus. Go to the Rheumatology activity and complete the homunculus joint exam.  Investigation: No additional findings.  Imaging: XR Cervical Spine 2 or 3 views  Result Date: 05/05/2019 Loss of cervical lordosis was noted.  Multilevel spondylosis with the narrowing between C4-C5, C5-C6, C6-C7 was noted.  Anterior spurring was noted from C4-C6.  Mild facet joint arthropathy was noted. Impression:  These findings are consistent with multilevel spondylosis and facet joint arthropathy.  XR Foot 2 Views Left  Result Date: 05/05/2019 First MTP narrowing, PIP and DIP narrowing was noted.  Postsurgical changes noted in the left second proximal phalanx.  No intertarsal or tibiotalar joint space narrowing was noted.  No calcaneal spur was noted. Impression: These findings are consistent with osteoarthritis of the foot.  XR Foot 2 Views Right  Result Date: 05/05/2019 First MTP, PIP and DIP narrowing was noted.  None of the other MTP showed any narrowing.  No intertarsal tibiotalar joint space narrowing was noted.  Small calcaneal spur was noted. Impression: These findings are consistent with osteoarthritis of the foot.  XR Hand 2 View Left  Result Date: 05/05/2019 PIP and DIP narrowing was noted.  CMC narrowing was noted.  No MCP, intercarpal radiocarpal joint space narrowing was noted.  No erosive changes were noted. Impression: These findings are consistent with osteoarthritis of the hand.  XR Hand 2 View  Right  Result Date: 05/05/2019 PIP and DIP narrowing was noted.  CMC narrowing was noted.  No MCP, intercarpal radiocarpal joint space narrowing was noted.  No erosive changes were noted. Impression: These findings are consistent with osteoarthritis of the hand.  XR Lumbar Spine 2-3 Views  Result Date: 05/05/2019 Mild multilevel spondylosis was noted.  L4-L5 narrowing was noted.  Facet joint arthropathy was noted.  No syndesmophytes were noted. Impression: These findings are consistent with multilevel spondylosis and facet joint arthropathy.  XR Pelvis 1-2 Views  Result Date: 05/05/2019 No SI joint to sclerosis or narrowing was noted.  Some osteoarthritic changes were noted in the SI joints.  Mild osteoarthritis was noted in the left hip joint. 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   Recent Labs: Lab Results  Component Value Date   WBC 5.8  04/29/2019   HGB 14.7 04/29/2019   PLT 131 (L) 04/29/2019   NA 141 04/29/2019   K 4.2 04/29/2019   CL 104 04/29/2019   CO2 22 04/29/2019   GLUCOSE 91 04/29/2019   BUN 18 04/29/2019   CREATININE 0.67 04/29/2019   BILITOT 0.4 04/29/2019   ALKPHOS 77 12/03/2018   AST 18 04/29/2019   ALT 11 04/29/2019   PROT 6.6 04/29/2019   ALBUMIN 4.6 12/03/2018   CALCIUM 9.2 04/29/2019   GFRAA 110 04/29/2019  April 29, 2018 ANA 1: 40 homogeneous, RF negative, anti-CCP negative, ESR 6, CK 52, HLA-B27 positive December 03, 2018 hepatitis C negative, HIV negative, lipid panel LDL 153  Speciality Comments: No specialty comments available.  Procedures:  No procedures performed Allergies: Inderal [propranolol]   Assessment / Plan:     Visit Diagnoses: HLA B27 positive - diagnosed with ankylosing spondylitis in 1992 by rheumatologist in [facility].  She was treated with Feldene for approximately 1 year.  Patient also developed an episode of plantar fasciitis and iritis multiple years ago but no recurrence.  All the x-rays obtained were reviewed with the patient.  She had no evidence of ankylosing spondylitis.  She is HLA-B27 positive which can be associated with multiple diseases which were discussed at length with the patient.  If she develops any new symptoms she should notify us.  Pain in both hands -she complains of pain and discomfort in her hands.  She had no synovitis on examination.  X-ray findings were consistent with osteoarthritis.  Joint protection muscle strengthening was discussed.  She has had right CMC joint surgery in the past.  Plan: XR Hand 2 View Right, XR Hand 2 View Left.  A handout on exercises was given. Natural anti-inflammatories was discussed.  Pain in both feet -she has pain and discomfort in her bilateral feet.  Clinical and radiographic findings were consistent with osteoarthritis.  Proper fitting shoes were discussed.  Plan: XR Foot 2 Views Right, XR Foot 2 Views  Left  DDD (degenerative disc disease), cervical -she has multilevel spondylosis and facet joint arthropathy.  She has no radiculopathy.  No syndesmophytes were noted.  She was given a handout on C-spine exercises.  Plan: XR Cervical Spine 2 or 3 views  Chronic SI joint pain - Plan: XR Pelvis 1-2 Views.  No SI joint sclerosis or narrowing was noted.  Chronic midline low back pain without sciatica - Plan: XR Lumbar Spine 2-3 Views.  She has degenerative disc disease and facet joint arthropathy.  No syndesmophytes were noted.  A handout on back exercises was given.  History  of iritis - episode of iritis about 12 to 15 years ago per patient and had no recurrence.  Have advised patient to contact me in case she develops any new symptoms.  Family history of ankylosing spondylitis - Father  Essential hypertension  Hx of migraines  Other fatigue  Orders: Orders Placed This Encounter  Procedures  . XR Cervical Spine 2 or 3 views  . XR Lumbar Spine 2-3 Views  . XR Pelvis 1-2 Views  . XR Hand 2 View Right  . XR Hand 2 View Left  . XR Foot 2 Views Right  . XR Foot 2 Views Left   No orders of the defined types were placed in this encounter.   Face-to-face time spent with patient was 450 minutes. Greater than 50% of time was spent in counseling and coordination of care.  Follow-Up Instructions: Return in about 6 months (around 11/05/2019) for Osteoarthritis.   Bo Merino, MD  Note - This record has been created using Editor, commissioning.  Chart creation errors have been sought, but may not always  have been located. Such creation errors do not reflect on  the standard of medical care.

## 2019-04-29 ENCOUNTER — Ambulatory Visit: Payer: BC Managed Care – PPO | Admitting: Rheumatology

## 2019-04-29 ENCOUNTER — Other Ambulatory Visit: Payer: Self-pay

## 2019-04-29 DIAGNOSIS — M450 Ankylosing spondylitis of multiple sites in spine: Secondary | ICD-10-CM

## 2019-04-29 DIAGNOSIS — M79642 Pain in left hand: Secondary | ICD-10-CM

## 2019-04-29 DIAGNOSIS — R5383 Other fatigue: Secondary | ICD-10-CM

## 2019-04-29 DIAGNOSIS — M79672 Pain in left foot: Secondary | ICD-10-CM

## 2019-04-29 DIAGNOSIS — M79641 Pain in right hand: Secondary | ICD-10-CM

## 2019-04-29 DIAGNOSIS — M79671 Pain in right foot: Secondary | ICD-10-CM

## 2019-04-30 NOTE — Progress Notes (Signed)
Dr. Estanislado Pandy will discuss lab work at upcoming new patient follow up visit on 05/05/19.

## 2019-05-05 ENCOUNTER — Encounter: Payer: Self-pay | Admitting: Rheumatology

## 2019-05-05 ENCOUNTER — Ambulatory Visit: Payer: Self-pay

## 2019-05-05 ENCOUNTER — Ambulatory Visit: Payer: BC Managed Care – PPO | Admitting: Rheumatology

## 2019-05-05 ENCOUNTER — Other Ambulatory Visit: Payer: Self-pay

## 2019-05-05 VITALS — BP 133/94 | HR 86 | Resp 13 | Ht 64.0 in | Wt 193.8 lb

## 2019-05-05 DIAGNOSIS — M79672 Pain in left foot: Secondary | ICD-10-CM

## 2019-05-05 DIAGNOSIS — M533 Sacrococcygeal disorders, not elsewhere classified: Secondary | ICD-10-CM

## 2019-05-05 DIAGNOSIS — Z1589 Genetic susceptibility to other disease: Secondary | ICD-10-CM | POA: Diagnosis not present

## 2019-05-05 DIAGNOSIS — M5030 Other cervical disc degeneration, unspecified cervical region: Secondary | ICD-10-CM | POA: Diagnosis not present

## 2019-05-05 DIAGNOSIS — Z8269 Family history of other diseases of the musculoskeletal system and connective tissue: Secondary | ICD-10-CM

## 2019-05-05 DIAGNOSIS — R5383 Other fatigue: Secondary | ICD-10-CM

## 2019-05-05 DIAGNOSIS — M545 Low back pain: Secondary | ICD-10-CM

## 2019-05-05 DIAGNOSIS — M79671 Pain in right foot: Secondary | ICD-10-CM | POA: Diagnosis not present

## 2019-05-05 DIAGNOSIS — M79641 Pain in right hand: Secondary | ICD-10-CM | POA: Diagnosis not present

## 2019-05-05 DIAGNOSIS — G8929 Other chronic pain: Secondary | ICD-10-CM

## 2019-05-05 DIAGNOSIS — I10 Essential (primary) hypertension: Secondary | ICD-10-CM

## 2019-05-05 DIAGNOSIS — Z8669 Personal history of other diseases of the nervous system and sense organs: Secondary | ICD-10-CM

## 2019-05-05 DIAGNOSIS — M79642 Pain in left hand: Secondary | ICD-10-CM

## 2019-05-05 NOTE — Patient Instructions (Signed)
Hand Exercises Hand exercises can be helpful for almost anyone. These exercises can strengthen the hands, improve flexibility and movement, and increase blood flow to the hands. These results can make work and daily tasks easier. Hand exercises can be especially helpful for people who have joint pain from arthritis or have nerve damage from overuse (carpal tunnel syndrome). These exercises can also help people who have injured a hand. Exercises Most of these hand exercises are gentle stretching and motion exercises. It is usually safe to do them often throughout the day. Warming up your hands before exercise may help to reduce stiffness. You can do this with gentle massage or by placing your hands in warm water for 10-15 minutes. It is normal to feel some stretching, pulling, tightness, or mild discomfort as you begin new exercises. This will gradually improve. Stop an exercise right away if you feel sudden, severe pain or your pain gets worse. Ask your health care provider which exercises are best for you. Knuckle bend or "claw" fist 1. Stand or sit with your arm, hand, and all five fingers pointed straight up. Make sure to keep your wrist straight during the exercise. 2. Gently bend your fingers down toward your palm until the tips of your fingers are touching the top of your palm. Keep your big knuckle straight and just bend the small knuckles in your fingers. 3. Hold this position for __________ seconds. 4. Straighten (extend) your fingers back to the starting position. Repeat this exercise 5-10 times with each hand. Full finger fist 1. Stand or sit with your arm, hand, and all five fingers pointed straight up. Make sure to keep your wrist straight during the exercise. 2. Gently bend your fingers into your palm until the tips of your fingers are touching the middle of your palm. 3. Hold this position for __________ seconds. 4. Extend your fingers back to the starting position, stretching every  joint fully. Repeat this exercise 5-10 times with each hand. Straight fist 1. Stand or sit with your arm, hand, and all five fingers pointed straight up. Make sure to keep your wrist straight during the exercise. 2. Gently bend your fingers at the big knuckle, where your fingers meet your hand, and the middle knuckle. Keep the knuckle at the tips of your fingers straight and try to touch the bottom of your palm. 3. Hold this position for __________ seconds. 4. Extend your fingers back to the starting position, stretching every joint fully. Repeat this exercise 5-10 times with each hand. Tabletop 1. Stand or sit with your arm, hand, and all five fingers pointed straight up. Make sure to keep your wrist straight during the exercise. 2. Gently bend your fingers at the big knuckle, where your fingers meet your hand, as far down as you can while keeping the small knuckles in your fingers straight. Think of forming a tabletop with your fingers. 3. Hold this position for __________ seconds. 4. Extend your fingers back to the starting position, stretching every joint fully. Repeat this exercise 5-10 times with each hand. Finger spread 1. Place your hand flat on a table with your palm facing down. Make sure your wrist stays straight as you do this exercise. 2. Spread your fingers and thumb apart from each other as far as you can until you feel a gentle stretch. Hold this position for __________ seconds. 3. Bring your fingers and thumb tight together again. Hold this position for __________ seconds. Repeat this exercise 5-10 times with each hand.   Making circles °1. Stand or sit with your arm, hand, and all five fingers pointed straight up. Make sure to keep your wrist straight during the exercise. °2. Make a circle by touching the tip of your thumb to the tip of your index finger. °3. Hold for __________ seconds. Then open your hand wide. °4. Repeat this motion with your thumb and each finger on your  hand. °Repeat this exercise 5-10 times with each hand. °Thumb motion °1. Sit with your forearm resting on a table and your wrist straight. Your thumb should be facing up toward the ceiling. Keep your fingers relaxed as you move your thumb. °2. Lift your thumb up as high as you can toward the ceiling. Hold for __________ seconds. °3. Bend your thumb across your palm as far as you can, reaching the tip of your thumb for the small finger (pinkie) side of your palm. Hold for __________ seconds. °Repeat this exercise 5-10 times with each hand. °Grip strengthening ° °1. Hold a stress ball or other soft ball in the middle of your hand. °2. Slowly increase the pressure, squeezing the ball as much as you can without causing pain. Think of bringing the tips of your fingers into the middle of your palm. All of your finger joints should bend when doing this exercise. °3. Hold your squeeze for __________ seconds, then relax. °Repeat this exercise 5-10 times with each hand. °Contact a health care provider if: °· Your hand pain or discomfort gets much worse when you do an exercise. °· Your hand pain or discomfort does not improve within 2 hours after you exercise. °If you have any of these problems, stop doing these exercises right away. Do not do them again unless your health care provider says that you can. °Get help right away if: °· You develop sudden, severe hand pain or swelling. If this happens, stop doing these exercises right away. Do not do them again unless your health care provider says that you can. °This information is not intended to replace advice given to you by your health care provider. Make sure you discuss any questions you have with your health care provider. °Document Revised: 05/22/2018 Document Reviewed: 01/30/2018 °Elsevier Patient Education © 2020 [employer] °Cervical Strain and Sprain Rehab °Ask your health care provider which exercises are safe for you. Do exercises exactly as told by your health  care provider and adjust them as directed. It is normal to feel mild stretching, pulling, tightness, or discomfort as you do these exercises. Stop right away if you feel sudden pain or your pain gets worse. Do not begin these exercises until told by your health care provider. °Stretching and range-of-motion exercises °Cervical side bending ° °1. Using good posture, sit on a stable chair or stand up. °2. Without moving your shoulders, slowly tilt your left / right ear to your shoulder until you feel a stretch in the opposite side neck muscles. You should be looking straight ahead. °3. Hold for __________ seconds. °4. Repeat with the other side of your neck. °Repeat __________ times. Complete this exercise __________ times a day. °Cervical rotation ° °1. Using good posture, sit on a stable chair or stand up. °2. Slowly turn your head to the side as if you are looking over your left / right shoulder. °? Keep your eyes level with the ground. °? Stop when you feel a stretch along the side and the back of your neck. °3. Hold for __________ seconds. °4. Repeat this by turning   to your other side. °Repeat __________ times. Complete this exercise __________ times a day. °Thoracic extension and pectoral stretch °1. Roll a towel or a small blanket so it is about 4 inches (10 cm) in diameter. °2. Lie down on your back on a firm surface. °3. Put the towel lengthwise, under your spine in the middle of your back. It should not be under your shoulder blades. The towel should line up with your spine from your middle back to your lower back. °4. Put your hands behind your head and let your elbows fall out to your sides. °5. Hold for __________ seconds. °Repeat __________ times. Complete this exercise __________ times a day. °Strengthening exercises °Isometric upper cervical flexion °1. Lie on your back with a thin pillow behind your head and a small rolled-up towel under your neck. °2. Gently tuck your chin toward your chest and nod  your head down to look toward your feet. Do not lift your head off the pillow. °3. Hold for __________ seconds. °4. Release the tension slowly. Relax your neck muscles completely before you repeat this exercise. °Repeat __________ times. Complete this exercise __________ times a day. °Isometric cervical extension ° °1. Stand about 6 inches (15 cm) away from a wall, with your back facing the wall. °2. Place a soft object, about 6-8 inches (15-20 cm) in diameter, between the back of your head and the wall. A soft object could be a small pillow, a ball, or a folded towel. °3. Gently tilt your head back and press into the soft object. Keep your jaw and forehead relaxed. °4. Hold for __________ seconds. °5. Release the tension slowly. Relax your neck muscles completely before you repeat this exercise. °Repeat __________ times. Complete this exercise __________ times a day. °Posture and body mechanics °Body mechanics refers to the movements and positions of your body while you do your daily activities. Posture is part of body mechanics. Good posture and healthy body mechanics can help to relieve stress in your body's tissues and joints. Good posture means that your spine is in its natural S-curve position (your spine is neutral), your shoulders are pulled back slightly, and your head is not tipped forward. The following are general guidelines for applying improved posture and body mechanics to your everyday activities. °Sitting ° °1. When sitting, keep your spine neutral and keep your feet flat on the floor. Use a footrest, if necessary, and keep your thighs parallel to the floor. Avoid rounding your shoulders, and avoid tilting your head forward. °2. When working at a desk or a computer, keep your desk at a height where your hands are slightly lower than your elbows. Slide your chair under your desk so you are close enough to maintain good posture. °3. When working at a computer, place your monitor at a height where you  are looking straight ahead and you do not have to tilt your head forward or downward to look at the screen. °Standing ° °· When standing, keep your spine neutral and keep your feet about hip-width apart. Keep a slight bend in your knees. Your ears, shoulders, and hips should line up. °· When you do a task in which you stand in one place for a long time, place one foot up on a stable object that is 2-4 inches (5-10 cm) high, such as a footstool. This helps keep your spine neutral. °Resting °When lying down and resting, avoid positions that are most painful for you. Try to support your neck in   a neutral position. You can use a contour pillow or a small rolled-up towel. Your pillow should support your neck but not push on it. This information is not intended to replace advice given to you by your health care provider. Make sure you discuss any questions you have with your health care provider. Document Revised: 05/21/2018 Document Reviewed: 10/30/2017 Elsevier Patient Education  2020 [place]. Back Exercises The following exercises strengthen the muscles that help to support the trunk and back. They also help to keep the lower back flexible. Doing these exercises can help to prevent back pain or lessen existing pain.  If you have back pain or discomfort, try doing these exercises 2-3 times each day or as told by your health care provider.  As your pain improves, do them once each day, but increase the number of times that you repeat the steps for each exercise (do more repetitions).  To prevent the recurrence of back pain, continue to do these exercises once each day or as told by your health care provider. Do exercises exactly as told by your health care provider and adjust them as directed. It is normal to feel mild stretching, pulling, tightness, or discomfort as you do these exercises, but you should stop right away if you feel sudden pain or your pain gets worse. Exercises Single knee to  chest Repeat these steps 3-5 times for each leg: 1. Lie on your back on a firm bed or the floor with your legs extended. 2. Bring one knee to your chest. Your other leg should stay extended and in contact with the floor. 3. Hold your knee in place by grabbing your knee or thigh with both hands and hold. 4. Pull on your knee until you feel a gentle stretch in your lower back or buttocks. 5. Hold the stretch for 10-30 seconds. 6. Slowly release and straighten your leg. Pelvic tilt Repeat these steps 5-10 times: 1. Lie on your back on a firm bed or the floor with your legs extended. 2. Bend your knees so they are pointing toward the ceiling and your feet are flat on the floor. 3. Tighten your lower abdominal muscles to press your lower back against the floor. This motion will tilt your pelvis so your tailbone points up toward the ceiling instead of pointing to your feet or the floor. 4. With gentle tension and even breathing, hold this position for 5-10 seconds. Cat-cow Repeat these steps until your lower back becomes more flexible: 1. Get into a hands-and-knees position on a firm surface. Keep your hands under your shoulders, and keep your knees under your hips. You may place padding under your knees for comfort. 2. Let your head hang down toward your chest. Contract your abdominal muscles and point your tailbone toward the floor so your lower back becomes rounded like the back of a cat. 3. Hold this position for 5 seconds. 4. Slowly lift your head, let your abdominal muscles relax and point your tailbone up toward the ceiling so your back forms a sagging arch like the back of a cow. 5. Hold this position for 5 seconds.  Press-ups Repeat these steps 5-10 times: 1. Lie on your abdomen (face-down) on the floor. 2. Place your palms near your head, about shoulder-width apart. 3. Keeping your back as relaxed as possible and keeping your hips on the floor, slowly straighten your arms to raise the  top half of your body and lift your shoulders. Do not use your back muscles  to raise your upper torso. You may adjust the placement of your hands to make yourself more comfortable. 4. Hold this position for 5 seconds while you keep your back relaxed. 5. Slowly return to lying flat on the floor.  Bridges Repeat these steps 10 times: 1. Lie on your back on a firm surface. 2. Bend your knees so they are pointing toward the ceiling and your feet are flat on the floor. Your arms should be flat at your sides, next to your body. 3. Tighten your buttocks muscles and lift your buttocks off the floor until your waist is at almost the same height as your knees. You should feel the muscles working in your buttocks and the back of your thighs. If you do not feel these muscles, slide your feet 1-2 inches farther away from your buttocks. 4. Hold this position for 3-5 seconds. 5. Slowly lower your hips to the starting position, and allow your buttocks muscles to relax completely. If this exercise is too easy, try doing it with your arms crossed over your chest. Abdominal crunches Repeat these steps 5-10 times: 1. Lie on your back on a firm bed or the floor with your legs extended. 2. Bend your knees so they are pointing toward the ceiling and your feet are flat on the floor. 3. Cross your arms over your chest. 4. Tip your chin slightly toward your chest without bending your neck. 5. Tighten your abdominal muscles and slowly raise your trunk (torso) high enough to lift your shoulder blades a tiny bit off the floor. Avoid raising your torso higher than that because it can put too much stress on your low back and does not help to strengthen your abdominal muscles. 6. Slowly return to your starting position. Back lifts Repeat these steps 5-10 times: 1. Lie on your abdomen (face-down) with your arms at your sides, and rest your forehead on the floor. 2. Tighten the muscles in your legs and your  buttocks. 3. Slowly lift your chest off the floor while you keep your hips pressed to the floor. Keep the back of your head in line with the curve in your back. Your eyes should be looking at the floor. 4. Hold this position for 3-5 seconds. 5. Slowly return to your starting position. Contact a health care provider if:  Your back pain or discomfort gets much worse when you do an exercise.  Your worsening back pain or discomfort does not lessen within 2 hours after you exercise. If you have any of these problems, stop doing these exercises right away. Do not do them again unless your health care provider says that you can. Get help right away if:  You develop sudden, severe back pain. If this happens, stop doing the exercises right away. Do not do them again unless your health care provider says that you can. This information is not intended to replace advice given to you by your health care provider. Make sure you discuss any questions you have with your health care provider. Document Revised: 06/05/2018 Document Reviewed: 10/31/2017 Elsevier Patient Education  [place].

## 2019-05-06 LAB — COMPLETE METABOLIC PANEL WITH GFR
AG Ratio: 1.9 (calc) (ref 1.0–2.5)
ALT: 11 U/L (ref 6–29)
AST: 18 U/L (ref 10–35)
Albumin: 4.3 g/dL (ref 3.6–5.1)
Alkaline phosphatase (APISO): 62 U/L (ref 37–153)
BUN: 18 mg/dL (ref 7–25)
CO2: 22 mmol/L (ref 20–32)
Calcium: 9.2 mg/dL (ref 8.6–10.4)
Chloride: 104 mmol/L (ref 98–110)
Creat: 0.67 mg/dL (ref 0.50–0.99)
GFR, Est African American: 110 mL/min/{1.73_m2} (ref >=60)
GFR, Est Non African American: 95 mL/min/{1.73_m2} (ref >=60)
Globulin: 2.3 g/dL (calc) (ref 1.9–3.7)
Glucose, Bld: 91 mg/dL (ref 65–99)
Potassium: 4.2 mmol/L (ref 3.5–5.3)
Sodium: 141 mmol/L (ref 135–146)
Total Bilirubin: 0.4 mg/dL (ref 0.2–1.2)
Total Protein: 6.6 g/dL (ref 6.1–8.1)

## 2019-05-06 LAB — 14-3-3 ETA PROTEIN: 14-3-3 eta Protein: <0.2 ng/mL (ref <0.2)

## 2019-05-06 LAB — CBC WITH DIFFERENTIAL/PLATELET
Absolute Monocytes: 534 cells/uL (ref 200–950)
Basophils Absolute: 41 cells/uL (ref 0–200)
Basophils Relative: 0.7 %
Eosinophils Absolute: 139 cells/uL (ref 15–500)
Eosinophils Relative: 2.4 %
HCT: 44.9 % (ref 35.0–45.0)
Hemoglobin: 14.7 g/dL (ref 11.7–15.5)
Lymphs Abs: 1636 cells/uL (ref 850–3900)
MCH: 29.5 pg (ref 27.0–33.0)
MCHC: 32.7 g/dL (ref 32.0–36.0)
MCV: 90 fL (ref 80.0–100.0)
MPV: 13.2 fL — ABNORMAL HIGH (ref 7.5–12.5)
Monocytes Relative: 9.2 %
Neutro Abs: 3451 cells/uL (ref 1500–7800)
Neutrophils Relative %: 59.5 %
Platelets: 131 10*3/uL — ABNORMAL LOW (ref 140–400)
RBC: 4.99 10*6/uL (ref 3.80–5.10)
RDW: 13.1 % (ref 11.0–15.0)
Total Lymphocyte: 28.2 %
WBC: 5.8 10*3/uL (ref 3.8–10.8)

## 2019-05-06 LAB — ANTI-NUCLEAR AB-TITER (ANA TITER): ANA Titer 1: 1:40 {titer} — ABNORMAL HIGH

## 2019-05-06 LAB — SEDIMENTATION RATE: Sed Rate: 6 mm/h (ref 0–30)

## 2019-05-06 LAB — ANA: Anti Nuclear Antibody (ANA): POSITIVE — AB

## 2019-05-06 LAB — HLA-B27 ANTIGEN: HLA-B27 Antigen: POSITIVE — AB

## 2019-05-06 LAB — RHEUMATOID FACTOR: Rheumatoid fact SerPl-aCnc: <14 IU/mL (ref <14)

## 2019-05-06 LAB — CYCLIC CITRUL PEPTIDE ANTIBODY, IGG: Cyclic Citrullin Peptide Ab: <16 UNITS

## 2019-05-06 LAB — CK: Total CK: 52 U/L (ref 29–143)

## 2019-05-06 NOTE — Progress Notes (Signed)
I discussed all the labs with the patient yesterday.

## 2019-05-06 NOTE — Progress Notes (Signed)
Would you like orders for ENA panel to be added?

## 2019-05-25 ENCOUNTER — Telehealth: Payer: Self-pay | Admitting: Physician Assistant

## 2019-05-25 NOTE — Telephone Encounter (Signed)
Spoke to pt told her she would need to contact her insurance to see if covered and whether it can be done at the office or the pharmacy. Pt verbalized understanding and said she did already check just needed to know if she can get it. Told pt yes she is of age for the vaccine. Asked pt if she had her COVID vaccine? Pt said yes last shot March 6th. Told pt then she is fine to have the Shingrix vaccine. Pt verbalized understanding.

## 2019-05-25 NOTE — Telephone Encounter (Signed)
Patient called in this morning and states she had a close family member who has shingles and wanted to know how to go about the shingles shot without paying a lot for it.

## 2019-05-27 ENCOUNTER — Other Ambulatory Visit: Payer: Self-pay | Admitting: Physician Assistant

## 2019-06-08 ENCOUNTER — Telehealth: Payer: Self-pay | Admitting: Physician Assistant

## 2019-06-08 MED ORDER — LOSARTAN POTASSIUM 100 MG PO TABS: 100.0000 mg | ORAL_TABLET | Freq: Every day | ORAL | 1 refills | Status: DC

## 2019-06-08 MED ORDER — HYDROCHLOROTHIAZIDE 12.5 MG PO TABS: 12.5000 mg | ORAL_TABLET | Freq: Every day | ORAL | 1 refills | Status: DC

## 2019-06-08 MED ORDER — SUMATRIPTAN SUCCINATE 100 MG PO TABS: 100.0000 mg | ORAL_TABLET | ORAL | 3 refills | Status: DC | PRN

## 2019-06-08 NOTE — Telephone Encounter (Signed)
MEDICATION:SUMAtriptan (IMITREX) 100 MG tablet  Patient says she down to her last week for these medications.  hydrochlorothiazide (HYDRODIURIL) 12.5 MG tablet losartan (COZAAR) 100 MG tablet   PHARMACY: [id number] - [address] [place] [employer] - [place]. Phone:  [phone]  Fax:  [phone]        Comments:   **Let patient know to contact pharmacy at the end of the day to make sure medication is ready. **  ** Please notify patient to allow 48-72 hours to process**  **Encourage patient to contact the pharmacy for refills or they can request refills through [facility]**

## 2019-06-08 NOTE — Telephone Encounter (Signed)
Pt notified Rx's sent to pharmacy. 

## 2019-07-07 ENCOUNTER — Other Ambulatory Visit: Payer: Self-pay

## 2019-07-07 ENCOUNTER — Ambulatory Visit: Payer: BC Managed Care – PPO | Admitting: Family Medicine

## 2019-07-07 VITALS — BP 118/82 | HR 69 | Temp 98.0°F | Ht 64.0 in | Wt 191.6 lb

## 2019-07-07 DIAGNOSIS — R300 Dysuria: Secondary | ICD-10-CM | POA: Diagnosis not present

## 2019-07-07 DIAGNOSIS — N3941 Urge incontinence: Secondary | ICD-10-CM | POA: Diagnosis not present

## 2019-07-07 LAB — POCT URINALYSIS DIPSTICK
Bilirubin, UA: NEGATIVE
Blood, UA: NEGATIVE
Glucose, UA: NEGATIVE
Ketones, UA: POSITIVE
Leukocytes, UA: NEGATIVE
Nitrite, UA: NEGATIVE
Protein, UA: NEGATIVE
Spec Grav, UA: >=1.03 — AB (ref 1.010–1.025)
Urobilinogen, UA: 0.2 E.U./dL
pH, UA: 5.5 (ref 5.0–8.0)

## 2019-07-07 MED ORDER — NITROFURANTOIN MONOHYD MACRO 100 MG PO CAPS: 100.0000 mg | ORAL_CAPSULE | Freq: Two times a day (BID) | ORAL | 0 refills | Status: DC

## 2019-07-07 NOTE — Patient Instructions (Signed)
It was nice to see you!  Please start the antibiotic.  We will check a urine culture to make sure you do not have a resistant bacteria. We will call you if we need to change your medications.   Please make sure you are drinking plenty of fluids over the next few days.  If your symptoms do not improve over the next 5-7 days, or if they worsen, please let us know. Please also let us know if you have worsening back pain, fevers, chills, or body aches.   I will place a referral for you to see the pelvic floor specialist.   Take care, Dr Jerline Pain

## 2019-07-07 NOTE — Progress Notes (Signed)
   Alison Mahoney is a 59 y.o. female who presents today for an office visit.  Assessment/Plan:  UTI UA negative, though history consistent with prior UTI. No signs of systemic illness. Will start empiric macrobid. Encouraged good oral hydration. Check Urine culture. Discussed reasons to return to care.   Urge Incontinence Referral to pelvic floor rehab placed.     Subjective:  HPI:  Patient with more urgency for the last day or two. Feels similar to previous UTIs. No fevers or chills. No nausea or vomiting. No back pain.  She would also like to be referred to pelvic floor rehab for urge incontinence. This referral was put in a few months ago but she had to cancel due to covid. Overall her symptoms are stable.        Objective:  Physical Exam: Temp 98 F (36.7 C) (Temporal)   Ht 5\' 4"  (1.626 m)   Wt 191 lb 9.6 oz (86.9 kg)   BMI 32.89 kg/m   Gen: No acute distress, resting comfortably Neuro: Grossly normal, moves all extremities Psych: Normal affect and thought content      Kamaal Cast M. Jerline Pain, MD 07/07/2019 3:52 PM

## 2019-07-08 LAB — URINE CULTURE
MICRO NUMBER:: 10517237
SPECIMEN QUALITY:: ADEQUATE

## 2019-07-10 NOTE — Progress Notes (Signed)
Please inform patient of the following:  HEr urine culture is inconclusive. Would like for her to finish her antibiotics and let us know if her symptoms are not improving.  Alison Mahoney. Jerline Pain, MD 07/10/2019 3:20 PM

## 2019-07-17 ENCOUNTER — Other Ambulatory Visit: Payer: Self-pay

## 2019-07-17 ENCOUNTER — Ambulatory Visit: Payer: BC Managed Care – PPO | Admitting: Physician Assistant

## 2019-07-17 ENCOUNTER — Encounter: Payer: Self-pay | Admitting: Physician Assistant

## 2019-07-17 VITALS — BP 120/74 | HR 85 | Temp 98.0°F | Ht 64.0 in | Wt 193.0 lb

## 2019-07-17 DIAGNOSIS — E785 Hyperlipidemia, unspecified: Secondary | ICD-10-CM

## 2019-07-17 DIAGNOSIS — R102 Pelvic and perineal pain: Secondary | ICD-10-CM

## 2019-07-17 DIAGNOSIS — Z1211 Encounter for screening for malignant neoplasm of colon: Secondary | ICD-10-CM

## 2019-07-17 MED ORDER — PRAVASTATIN SODIUM 10 MG PO TABS: 10.0000 mg | ORAL_TABLET | Freq: Every day | ORAL | 1 refills | Status: DC

## 2019-07-17 NOTE — Addendum Note (Signed)
Addended by: Betti Cruz on: 07/17/2019 04:58 PM   Modules accepted: Orders

## 2019-07-17 NOTE — Progress Notes (Signed)
Alison Mahoney is a 59 y.o. female here for a follow up of a pre-existing problem.  I acted as a Education administrator for [employer], PA-C Anselmo Pickler, LPN   History of Present Illness:   Chief Complaint  Patient presents with  . Discuss UTI and follow up plan  . Pelvic discomfort    HPI  Patient was seen in our office on 07/07/19 by Dr. Jerline Mahoney with urinary urgency. She was given macrobid for presumed UTI. Urine culture was inconclusive and she was told to complete her macrobid. Pt completed macrobid and urinary symptoms have resolved.  When she was given these results, she said that she's been having persistent lower abdominal Mahoney and that her abdomen feels "hard and different than normal." She is having nighttime wakenings of abdominal Mahoney. Pt is still having some lower pelvic discomfort when she touches her abdomen. She had an abdominal hysterectomy in 1988 and still has ovaries. Denies vaginal/rectal or bleeding.  Last/only colonoscopy at age 62 -- was normal. Has had 8 lb weight gain since Oct 2020.  Wt Readings from Last 4 Encounters:  07/17/19 193 lb (87.5 kg)  07/07/19 191 lb 9.6 oz (86.9 kg)  05/05/19 193 lb 12.8 oz (87.9 kg)  12/03/18 185 lb 6.1 oz (84.1 kg)    HLD She is also here to follow-up on her lipid panel and would like to possibly start pravastatin. The 10-year ASCVD risk score Mikey Bussing DC Brooke Bonito., et al., 2013) is: 5.3%   Values used to calculate the score:     Age: 52 years     Sex: Female     Is Non-Hispanic African American: No     Diabetic: No     Tobacco smoker: No     Systolic Blood Pressure: 250 mmHg     Is BP treated: Yes     HDL Cholesterol: 46.2 mg/dL     Total Cholesterol: 229 mg/dL    Past Medical History:  Diagnosis Date  . Ankylosing spondylitis ([place])    35 years ago  . Hyperlipidemia   . Hypertension   . Migraines      Social History   Tobacco Use  . Smoking status: Never Smoker  . Smokeless tobacco: Never Used  Substance Use Topics  .  Alcohol use: Not Currently  . Drug use: Not Currently    Past Surgical History:  Procedure Laterality Date  . ABDOMINAL HYSTERECTOMY  1988  . lt hand surgery  1985  . Lt middle toe surgery  1977  . Rt thumb joint replacement  1982  . vaginal delivery x3  1979, 1982, 1986    Family History  Problem Relation Age of Onset  . Arthritis Mother   . Alzheimer's disease Mother   . Arthritis Father   . Diabetes Brother   . Healthy Daughter   . Healthy Son   . Cancer Maternal Grandmother   . Cancer Paternal Grandmother   . Cancer Paternal Grandfather   . Diabetes Paternal Grandfather   . Healthy Son     Allergies  Allergen Reactions  . Inderal [Propranolol] Rash    Current Medications:   Current Outpatient Medications:  .  Adapalene 0.3 % gel, APPLY TO FACE AT BEDTIME, Disp: , Rfl:  .  b complex vitamins tablet, Take 1 tablet by mouth daily., Disp: , Rfl:  .  BIOTIN PO, Take by mouth daily., Disp: , Rfl:  .  co-enzyme Q-10 30 MG capsule, Take 30 mg by mouth daily., Disp: ,  Rfl:  .  hydrochlorothiazide (HYDRODIURIL) 12.5 MG tablet, Take 1 tablet (12.5 mg total) by mouth daily., Disp: 90 tablet, Rfl: 1 .  losartan (COZAAR) 100 MG tablet, Take 1 tablet (100 mg total) by mouth daily., Disp: 90 tablet, Rfl: 1 .  naproxen sodium (ALEVE) 220 MG tablet, Take 220 mg by mouth as needed., Disp: , Rfl:  .  OVER THE COUNTER MEDICATION, Take 2 tablets by mouth daily. Cardio Tonic BP, Disp: , Rfl:  .  SUMAtriptan (IMITREX) 100 MG tablet, Take 1 tablet (100 mg total) by mouth every 2 (two) hours as needed for migraine. As needed for Migraines, Disp: 10 tablet, Rfl: 3 .  pravastatin (PRAVACHOL) 10 MG tablet, Take 1 tablet (10 mg total) by mouth daily., Disp: 90 tablet, Rfl: 1   Review of Systems:   ROS  Negative unless otherwise specified per HPI.  Vitals:   Vitals:   07/17/19 1605  BP: 120/74  Pulse: 85  Temp: 98 F (36.7 C)  TempSrc: Temporal  SpO2: 98%  Weight: 193 lb (87.5 kg)   Height: 5\' 4"  (1.626 m)     Body mass index is 33.13 kg/m.  Physical Exam:   Physical Exam Vitals and nursing note reviewed.  Constitutional:      General: She is not in acute distress.    Appearance: She is well-developed. She is not ill-appearing or toxic-appearing.  Cardiovascular:     Rate and Rhythm: Normal rate and regular rhythm.     Pulses: Normal pulses.     Heart sounds: Normal heart sounds, S1 normal and S2 normal.     Comments: No LE edema Pulmonary:     Effort: Pulmonary effort is normal.     Breath sounds: Normal breath sounds.  Abdominal:    Skin:    General: Skin is warm and dry.  Neurological:     Mental Status: She is alert.     GCS: GCS eye subscore is 4. GCS verbal subscore is 5. GCS motor subscore is 6.  Psychiatric:        Speech: Speech normal.        Behavior: Behavior normal. Behavior is cooperative.      Assessment and Plan:   Alison Mahoney was seen today for discuss uti and follow up plan and pelvic discomfort.  Diagnoses and all orders for this visit:  Special screening for malignant neoplasms, colon -     Cologuard  Pelvic Mahoney Will obtain CBC and CMP, as well as stat pelvic u/s for further evaluation. Management based upon results. -     US Pelvic Complete With Transvaginal; Future -     CBC with Differential/Platelet; Future -     Comprehensive metabolic panel; Future  HLD Start 10 mg pravastatin daily. Follow-up in 3-6 months, sooner if concerns.  Other orders -     pravastatin (PRAVACHOL) 10 MG tablet; Take 1 tablet (10 mg total) by mouth daily.  . Reviewed expectations re: course of current medical issues. . Discussed self-management of symptoms. . Outlined signs and symptoms indicating need for more acute intervention. . Patient verbalized understanding and all questions were answered. . See orders for this visit as documented in the electronic medical record. . Patient received an After-Visit Summary.  CMA or LPN served as  scribe during this visit. History, Physical, and Plan performed by medical provider. The above documentation has been reviewed and is accurate and complete.   [name], PA-C

## 2019-07-17 NOTE — Patient Instructions (Signed)
It was great to see you!  I will be in touch with your lab results.  Please get your ultrasound as scheduled and we will go from there.  Take care,  [name] PA-C

## 2019-07-20 ENCOUNTER — Other Ambulatory Visit: Payer: Self-pay | Admitting: Physician Assistant

## 2019-07-20 ENCOUNTER — Ambulatory Visit (HOSPITAL_COMMUNITY)
Admission: RE | Admit: 2019-07-20 | Discharge: 2019-07-20 | Disposition: A | Discharge status: Home / Self Care | Payer: BC Managed Care – PPO | Source: Ambulatory Visit | Attending: Physician Assistant | Admitting: Physician Assistant

## 2019-07-20 ENCOUNTER — Telehealth: Payer: Self-pay | Admitting: Physician Assistant

## 2019-07-20 ENCOUNTER — Other Ambulatory Visit: Payer: Self-pay

## 2019-07-20 DIAGNOSIS — N83209 Unspecified ovarian cyst, unspecified side: Secondary | ICD-10-CM

## 2019-07-20 DIAGNOSIS — R102 Pelvic and perineal pain: Secondary | ICD-10-CM | POA: Insufficient documentation

## 2019-07-20 DIAGNOSIS — N838 Other noninflammatory disorders of ovary, fallopian tube and broad ligament: Secondary | ICD-10-CM

## 2019-07-20 MED ORDER — LORAZEPAM 0.5 MG PO TABS
0.5000 mg | ORAL_TABLET | Freq: Two times a day (BID) | ORAL | 0 refills | Status: DC | PRN
Start: 2019-07-20 — End: 2019-07-22

## 2019-07-20 NOTE — Telephone Encounter (Signed)
Alison Mahoney spoke with the patient about her results.

## 2019-07-20 NOTE — Telephone Encounter (Signed)
Patient states that when scheduling her MRI she discussed that the results would be back by this afternoon but when leaving she was told that the results were going to come back in 3-4 days. Patient is just curious as to when they will come back.

## 2019-07-21 ENCOUNTER — Other Ambulatory Visit: Payer: Self-pay | Admitting: Physician Assistant

## 2019-07-21 DIAGNOSIS — N838 Other noninflammatory disorders of ovary, fallopian tube and broad ligament: Secondary | ICD-10-CM

## 2019-07-22 ENCOUNTER — Ambulatory Visit
Admission: RE | Admit: 2019-07-22 | Discharge: 2019-07-22 | Disposition: A | Discharge status: Home / Self Care | Payer: BC Managed Care – PPO | Source: Ambulatory Visit | Attending: Physician Assistant | Admitting: Physician Assistant

## 2019-07-22 ENCOUNTER — Other Ambulatory Visit: Payer: Self-pay

## 2019-07-22 ENCOUNTER — Ambulatory Visit: Payer: BC Managed Care – PPO | Admitting: Physician Assistant

## 2019-07-22 ENCOUNTER — Other Ambulatory Visit: Payer: Self-pay | Admitting: Physician Assistant

## 2019-07-22 DIAGNOSIS — N838 Other noninflammatory disorders of ovary, fallopian tube and broad ligament: Secondary | ICD-10-CM | POA: Insufficient documentation

## 2019-07-22 MED ORDER — GADOBUTROL 1 MMOL/ML IV SOLN
9.0000 mL | Freq: Once | INTRAVENOUS | Status: AC | PRN
  Administered 2019-07-22: 9 mL via INTRAVENOUS

## 2019-07-22 MED ORDER — LORAZEPAM 0.5 MG PO TABS: 0.5000 mg | ORAL_TABLET | Freq: Two times a day (BID) | ORAL | 1 refills | Status: DC

## 2019-07-27 LAB — COLOGUARD: Cologuard: NEGATIVE

## 2019-07-31 ENCOUNTER — Encounter: Payer: Self-pay | Admitting: Physician Assistant

## 2019-07-31 ENCOUNTER — Telehealth: Payer: Self-pay | Admitting: *Deleted

## 2019-07-31 NOTE — Telephone Encounter (Signed)
Printed note and left in "Completed" folder outside my office

## 2019-07-31 NOTE — Telephone Encounter (Addendum)
Pt called wanting to know if she can get a note for work or short term disability for the 2 weeks that she has been off working having testing done and preparing for surgery. Told pt I will have to check with [facility] and get back to you. Pt verbalized understanding. Letter mailed.

## 2019-07-31 NOTE — Telephone Encounter (Signed)
Samantha see message.

## 2019-08-03 LAB — COLOGUARD: COLOGUARD: NEGATIVE

## 2019-08-03 NOTE — Telephone Encounter (Signed)
Spoke to pt told her I have her letter ready for work. Pt verbalized understanding and asked if I would please mail it. Told pt will put in mail today. Pt verbalized understanding.

## 2019-08-04 ENCOUNTER — Encounter: Payer: Self-pay | Admitting: Physician Assistant

## 2019-08-05 HISTORY — PX: SALPINGECTOMY: SHX328

## 2019-08-05 HISTORY — PX: OOPHORECTOMY: SHX86

## 2019-08-05 HISTORY — PX: OTHER SURGICAL HISTORY: SHX169

## 2019-08-07 DIAGNOSIS — N9489 Other specified conditions associated with female genital organs and menstrual cycle: Secondary | ICD-10-CM | POA: Insufficient documentation

## 2019-08-07 DIAGNOSIS — E785 Hyperlipidemia, unspecified: Secondary | ICD-10-CM | POA: Insufficient documentation

## 2019-08-10 ENCOUNTER — Other Ambulatory Visit: Payer: Self-pay

## 2019-08-10 ENCOUNTER — Encounter: Payer: Self-pay | Admitting: Physician Assistant

## 2019-08-10 ENCOUNTER — Ambulatory Visit: Payer: BC Managed Care – PPO | Admitting: Physician Assistant

## 2019-08-10 VITALS — BP 138/76 | HR 75 | Temp 98.5°F | Ht 64.0 in | Wt 189.5 lb

## 2019-08-10 DIAGNOSIS — N9489 Other specified conditions associated with female genital organs and menstrual cycle: Secondary | ICD-10-CM | POA: Diagnosis not present

## 2019-08-10 DIAGNOSIS — R21 Rash and other nonspecific skin eruption: Secondary | ICD-10-CM

## 2019-08-10 DIAGNOSIS — I10 Essential (primary) hypertension: Secondary | ICD-10-CM

## 2019-08-10 MED ORDER — TRIAMCINOLONE ACETONIDE 0.1 % EX CREA
TOPICAL_CREAM | CUTANEOUS | 0 refills | Status: DC
Start: 2019-08-10 — End: 2019-11-23

## 2019-08-10 NOTE — Progress Notes (Signed)
Alison Mahoney is a 59 y.o. female is here for follow up.  I acted as a Education administrator for [employer], PA-C Anselmo Pickler, LPN   History of Present Illness:   Chief Complaint  Patient presents with  . Hospitalization Follow-up  . Hypotension  . Rash    HPI   Pt is here for hospital follow up. She was admitted on 06/23 for surgery for removal of adnexal mass. She is currently awaiting pathology results.  She is healing well and denies any significant concerns regarding her surgery.  Hypotension Pt following up today due to blood pressure was recently low while in the hospital. Pt has not been on Hydrochlorothiazide or Losartan since last Tuesday 06/22. Pt woke up with a headache on Friday morning and now today ankles and feet are puffy. Denies dizziness, chest pain, shortness of breath.  BP Readings from Last 3 Encounters:  08/10/19 138/76  07/17/19 120/74  07/07/19 118/82   Wt Readings from Last 5 Encounters:  08/10/19 189 lb 8 oz (86 kg)  07/17/19 193 lb (87.5 kg)  07/07/19 191 lb 9.6 oz (86.9 kg)  05/05/19 193 lb 12.8 oz (87.9 kg)  12/03/18 185 lb 6.1 oz (84.1 kg)    Rash Pt c/o rash on abdomen noticed yesterday, slight itching. Started under her bra line -- looked like a reaction to adhesive tape.  And she started to have a different type of rash on her upper abdomen.  Denies abdominal pain, fever, chills.  She did wear an abdominal binder for a brief period of time immediately after surgery.  Health Maintenance Due  Topic Date Due  . MAMMOGRAM  05/18/2018    Past Medical History:  Diagnosis Date  . Ankylosing spondylitis ([place])    35 years ago  . Hyperlipidemia   . Hypertension   . Migraines      Social History   Tobacco Use  . Smoking status: Never Smoker  . Smokeless tobacco: Never Used  Vaping Use  . Vaping Use: Never used  Substance Use Topics  . Alcohol use: Not Currently  . Drug use: Not Currently    Past Surgical History:  Procedure  Laterality Date  . ABDOMINAL HYSTERECTOMY  1988  . lt hand surgery  1985  . Lt middle toe surgery  1977  . Rt thumb joint replacement  1982  . vaginal delivery x3  1979, 1982, 1986    Family History  Problem Relation Age of Onset  . Arthritis Mother   . Alzheimer's disease Mother   . Arthritis Father   . Diabetes Brother   . Healthy Daughter   . Healthy Son   . Cancer Maternal Grandmother   . Cancer Paternal Grandmother   . Cancer Paternal Grandfather   . Diabetes Paternal Grandfather   . Healthy Son     PMHx, SurgHx, SocialHx, FamHx, Medications, and Allergies were reviewed in the Visit Navigator and updated as appropriate.   Patient Active Problem List   Diagnosis Date Noted  . 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 Hyperlipidemia 08/07/2019  . HTN (hypertension) 04/13/2016  . Migraine 04/13/2016    Social History   Tobacco Use  . Smoking status: Never Smoker  . Smokeless tobacco: Never Used  Vaping Use  . Vaping Use: Never used  Substance Use Topics  . Alcohol use: Not Currently  . Drug use: Not Currently    Current Medications and Allergies:    Current Outpatient Medications:  .  b complex vitamins tablet,  Take 1 tablet by mouth daily., Disp: , Rfl:  .  BIOTIN PO, Take by mouth daily., Disp: , Rfl:  .  co-enzyme Q-10 30 MG capsule, Take 30 mg by mouth daily., Disp: , Rfl:  .  LORazepam (ATIVAN) 0.5 MG tablet, Take 1 tablet (0.5 mg total) by mouth 2 (two) times daily., Disp: 60 tablet, Rfl: 1 .  naproxen sodium (ALEVE) 220 MG tablet, Take 220 mg by mouth as needed., Disp: , Rfl:  .  OVER THE COUNTER MEDICATION, Take 2 tablets by mouth daily. Cardio Tonic BP, Disp: , Rfl:  .  SUMAtriptan (IMITREX) 100 MG tablet, Take 1 tablet (100 mg total) by mouth every 2 (two) hours as needed for migraine. As needed for Migraines, Disp: 10 tablet, Rfl: 3 .  hydrochlorothiazide (HYDRODIURIL) 12.5 MG tablet, Take 1 tablet (12.5 mg total) by mouth daily. (Patient not taking: Reported on  08/10/2019), Disp: 90 tablet, Rfl: 1 .  losartan (COZAAR) 100 MG tablet, Take 1 tablet (100 mg total) by mouth daily. (Patient not taking: Reported on 08/10/2019), Disp: 90 tablet, Rfl: 1 .  pravastatin (PRAVACHOL) 10 MG tablet, Take 1 tablet (10 mg total) by mouth daily. (Patient not taking: Reported on 08/10/2019), Disp: 90 tablet, Rfl: 1 .  triamcinolone cream (KENALOG) 0.1 %, Apply to affected area 1-2 times daily, Disp: 30 g, Rfl: 0   Allergies  Allergen Reactions  . Inderal [Propranolol] Rash  . Tomato Rash    Review of Systems   ROS  Negative unless otherwise specified per HPI.  Vitals:   Vitals:   08/10/19 1047  BP: 138/76  Pulse: 75  Temp: 98.5 F (36.9 C)  TempSrc: Temporal  SpO2: 97%  Weight: 189 lb 8 oz (86 kg)  Height: 5\' 4"  (1.626 m)     Body mass index is 32.53 kg/m.   Physical Exam:    Physical Exam Vitals and nursing note reviewed.  Constitutional:      General: She is not in acute distress.    Appearance: She is well-developed. She is not ill-appearing or toxic-appearing.  Cardiovascular:     Rate and Rhythm: Normal rate and regular rhythm.     Pulses: Normal pulses.     Heart sounds: Normal heart sounds, S1 normal and S2 normal.     Comments: Trace bilateral LE edema Pulmonary:     Effort: Pulmonary effort is normal.     Breath sounds: Normal breath sounds.  Skin:    General: Skin is warm and dry.     Comments: Well demarcated 1 cm linear area of erythema under bilateral breasts  Erythematous scattered papules on upper abdomen, no open lesions  Neurological:     Mental Status: She is alert.     GCS: GCS eye subscore is 4. GCS verbal subscore is 5. GCS motor subscore is 6.  Psychiatric:        Speech: Speech normal.        Behavior: Behavior normal. Behavior is cooperative.      Assessment and Plan:    Alison Mahoney was seen today for hospitalization follow-up, hypotension and rash.  Diagnoses and all orders for this visit:  Essential  hypertension Numbers are in control today, however I do think that she may need to resume and as needed diuretic to help with lower extremity edema.  I have asked her to keep close tabs on her blood pressure, and also to start her hydrochlorothiazide 12.5 mg as needed for her swelling.  I recommend  that she follow-up in the office in 2 to 4 weeks, sooner if concerns.  No red flags on exam --no swelling in calves, chest pain or shortness of breath.  She is being proactive about walking every hour while she is awake to help with preventing blood clots and staying active.  Rash Suspect dermatitis from tape, and possible other contact dermatitis on abdomen.  Will trial topical Kenalog, follow-up in office if no improvement of symptoms or worsening.  Adnexal mass  Management per surgeon's office.  Overall doing well.  other orders -     triamcinolone cream (KENALOG) 0.1 %; Apply to affected area 1-2 times daily  . Reviewed expectations re: course of current medical issues. . Discussed self-management of symptoms. . Outlined signs and symptoms indicating need for more acute intervention. . Patient verbalized understanding and all questions were answered. . See orders for this visit as documented in the electronic medical record. . Patient received an After Visit Summary.  CMA or LPN served as scribe during this visit. History, Physical, and Plan performed by medical provider. The above documentation has been reviewed and is accurate and complete.  [name], PA-C [place], [place] 08/10/2019  Follow-up: No follow-ups on file.

## 2019-08-10 NOTE — Patient Instructions (Signed)
It was great to see you!  Start your hydrochlorothiazide on an as needed basis. Follow-up with me in 2-4 weeks regarding this, sooner if concerns.  Start topical triamcinolone for your rash. If no improvement or worsening please let me know.   How to Take Your Blood Pressure You can take your blood pressure at home with a machine. You may need to check your blood pressure at home:  To check if you have high blood pressure (hypertension).  To check your blood pressure over time.  To make sure your blood pressure medicine is working. Supplies needed: You will need a blood pressure machine, or monitor. You can buy one at a drugstore or online. When choosing one:  Choose one with an arm cuff.  Choose one that wraps around your upper arm. Only one finger should fit between your arm and the cuff.  Do not choose one that measures your blood pressure from your wrist or finger. Your doctor can suggest a monitor. How to prepare Avoid these things for 30 minutes before checking your blood pressure:  Drinking caffeine.  Drinking alcohol.  Eating.  Smoking.  Exercising. Five minutes before checking your blood pressure:  Pee.  Sit in a dining chair. Avoid sitting in a soft couch or armchair.  Be quiet. Do not talk. How to take your blood pressure Follow the instructions that came with your machine. If you have a digital blood pressure monitor, these may be the instructions: 1. Sit up straight. 2. Place your feet on the floor. Do not cross your ankles or legs. 3. Rest your left arm at the level of your heart. You may rest it on a table, desk, or chair. 4. Pull up your shirt sleeve. 5. Wrap the blood pressure cuff around the upper part of your left arm. The cuff should be 1 inch (2.5 cm) above your elbow. It is best to wrap the cuff around bare skin. 6. Fit the cuff snugly around your arm. You should be able to place only one finger between the cuff and your arm. 7. Put the cord  inside the groove of your elbow. 8. Press the power button. 9. Sit quietly while the cuff fills with air and loses air. 10. Write down the numbers on the screen. 11. Wait 2-3 minutes and then repeat steps 1-10. What do the numbers mean? Two numbers make up your blood pressure. The first number is called systolic pressure. The second is called diastolic pressure. An example of a blood pressure reading is "120 over 80" (or 120/80). If you are an adult and do not have a medical condition, use this guide to find out if your blood pressure is normal: Normal  First number: below 120.  Second number: below 80. Elevated  First number: 120-129.  Second number: below 80. Hypertension stage 1  First number: 130-139.  Second number: 80-89. Hypertension stage 2  First number: 140 or above.  Second number: 67 or above. Your blood pressure is above normal even if only the top or bottom number is above normal. Follow these instructions at home:  Check your blood pressure as often as your doctor tells you to.  Take your monitor to your next doctor's appointment. Your doctor will: ? Make sure you are using it correctly. ? Make sure it is working right.  Make sure you understand what your blood pressure numbers should be.  Tell your doctor if your medicines are causing side effects. Contact a doctor if:  Your  blood pressure keeps being high. Get help right away if:  Your first blood pressure number is higher than 180.  Your second blood pressure number is higher than 120. This information is not intended to replace advice given to you by your health care provider. Make sure you discuss any questions you have with your health care provider. Document Revised: 01/11/2017 Document Reviewed: 07/08/2015 Elsevier Patient Education  2020 [employer],  [name] PA-C

## 2019-08-28 ENCOUNTER — Ambulatory Visit (INDEPENDENT_AMBULATORY_CARE_PROVIDER_SITE_OTHER): Payer: BC Managed Care – PPO | Admitting: Physician Assistant

## 2019-08-28 ENCOUNTER — Encounter: Payer: Self-pay | Admitting: Physician Assistant

## 2019-08-28 ENCOUNTER — Other Ambulatory Visit: Payer: Self-pay

## 2019-08-28 VITALS — BP 134/90 | HR 81 | Temp 98.0°F | Ht 64.0 in | Wt 180.0 lb

## 2019-08-28 DIAGNOSIS — I10 Essential (primary) hypertension: Secondary | ICD-10-CM | POA: Diagnosis not present

## 2019-08-28 MED ORDER — LOSARTAN POTASSIUM-HCTZ 50-12.5 MG PO TABS
1.0000 | ORAL_TABLET | Freq: Every day | ORAL | 1 refills | Status: DC
Start: 2019-08-28 — End: 2019-10-20

## 2019-08-28 NOTE — Patient Instructions (Signed)
It was great to see you!  Stop current HCTZ and start combination Losartan-HCTZ (Hyzaar).  This has been sent in for you.  Let's follow-up before you go back to work.  Take care,  [name] PA-C

## 2019-08-28 NOTE — Progress Notes (Signed)
Alison Mahoney is a 59 y.o. female is here for follow up.  I acted as a Education administrator for [employer], PA-C [employer], [place]   History of Present Illness:   Chief Complaint  Patient presents with  . Hypertension    HPI   Hypertension Pt following up today, was started back on HCTZ 12.5 mg daily.  Pt checks BP readings at home. Readings were doing fine at first but has been >140/90 recently. Pt c/o of a HA for three days this week. Took Imitrex, which helps. Denies dizziness or visual changes.  Denies: chest pain, SOB, worsening swelling in LE  BP Readings from Last 3 Encounters:  08/28/19 134/90  08/10/19 138/76  07/17/19 120/74     Health Maintenance Due  Topic Date Due  . MAMMOGRAM  05/18/2018    Past Medical History:  Diagnosis Date  . Ankylosing spondylitis ([place])    35 years ago  . Hyperlipidemia   . Hypertension   . Migraines      Social History   Tobacco Use  . Smoking status: Never Smoker  . Smokeless tobacco: Never Used  Vaping Use  . Vaping Use: Never used  Substance Use Topics  . Alcohol use: Not Currently  . Drug use: Not Currently    Past Surgical History:  Procedure Laterality Date  . ABDOMINAL HYSTERECTOMY  1988  . lt hand surgery  1985  . Lt middle toe surgery  1977  . Rt thumb joint replacement  1982  . vaginal delivery x3  1979, 1982, 1986    Family History  Problem Relation Age of Onset  . Arthritis Mother   . Alzheimer's disease Mother   . Arthritis Father   . Diabetes Brother   . Healthy Daughter   . Healthy Son   . Cancer Maternal Grandmother   . Cancer Paternal Grandmother   . Cancer Paternal Grandfather   . Diabetes Paternal Grandfather   . Healthy Son     PMHx, SurgHx, SocialHx, FamHx, Medications, and Allergies were reviewed in the Visit Navigator and updated as appropriate.   Patient Active Problem List   Diagnosis Date Noted  . 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 Hyperlipidemia 08/07/2019  . HTN (hypertension)  04/13/2016  . Migraine 04/13/2016    Social History   Tobacco Use  . Smoking status: Never Smoker  . Smokeless tobacco: Never Used  Vaping Use  . Vaping Use: Never used  Substance Use Topics  . Alcohol use: Not Currently  . Drug use: Not Currently    Current Medications and Allergies:    Current Outpatient Medications:  .  b complex vitamins tablet, Take 1 tablet by mouth daily., Disp: , Rfl:  .  BIOTIN PO, Take by mouth daily., Disp: , Rfl:  .  co-enzyme Q-10 30 MG capsule, Take 30 mg by mouth daily., Disp: , Rfl:  .  naproxen sodium (ALEVE) 220 MG tablet, Take 220 mg by mouth as needed., Disp: , Rfl:  .  OVER THE COUNTER MEDICATION, Take 2 tablets by mouth daily. Cardio Tonic BP, Disp: , Rfl:  .  SUMAtriptan (IMITREX) 100 MG tablet, Take 1 tablet (100 mg total) by mouth every 2 (two) hours as needed for migraine. As needed for Migraines, Disp: 10 tablet, Rfl: 3 .  triamcinolone cream (KENALOG) 0.1 %, Apply to affected area 1-2 times daily, Disp: 30 g, Rfl: 0 .  LORazepam (ATIVAN) 0.5 MG tablet, Take 1 tablet (0.5 mg total) by mouth 2 (two)  times daily. (Patient not taking: Reported on 08/28/2019), Disp: 60 tablet, Rfl: 1 .  losartan-hydrochlorothiazide (HYZAAR) 50-12.5 MG tablet, Take 1 tablet by mouth daily., Disp: 30 tablet, Rfl: 1 .  pravastatin (PRAVACHOL) 10 MG tablet, Take 1 tablet (10 mg total) by mouth daily. (Patient not taking: Reported on 08/28/2019), Disp: 90 tablet, Rfl: 1   Allergies  Allergen Reactions  . Inderal [Propranolol] Rash  . Tomato Rash    Review of Systems   ROS  Negative unless otherwise specified per HPI.  Vitals:   Vitals:   08/28/19 0952  BP: 134/90  Pulse: 81  Temp: 98 F (36.7 C)  TempSrc: Temporal  SpO2: 96%  Weight: 180 lb (81.6 kg)  Height: 5\' 4"  (1.626 m)     Body mass index is 30.9 kg/m.   Physical Exam:    Physical Exam Vitals and nursing note reviewed.  Constitutional:      General: She is not in acute distress.     Appearance: She is well-developed. She is not ill-appearing or toxic-appearing.  Cardiovascular:     Rate and Rhythm: Normal rate and regular rhythm.     Pulses: Normal pulses.     Heart sounds: Normal heart sounds, S1 normal and S2 normal.     Comments: No LE edema Pulmonary:     Effort: Pulmonary effort is normal.     Breath sounds: Normal breath sounds.  Skin:    General: Skin is warm and dry.  Neurological:     Mental Status: She is alert.     GCS: GCS eye subscore is 4. GCS verbal subscore is 5. GCS motor subscore is 6.  Psychiatric:        Speech: Speech normal.        Behavior: Behavior normal. Behavior is cooperative.      Assessment and Plan:    Alison Mahoney was seen today for hypertension.  Diagnoses and all orders for this visit:  Essential hypertension Uncontrolled at home, she is buying a new cuff. She would like to increase her regimen, and we decided to change HCTZ 12.5 mg to Losartan 50 -- HCTZ 12.5 mg daily. Follow-up in 3 weeks per patient request, sooner if concerns.  Other orders -     losartan-hydrochlorothiazide (HYZAAR) 50-12.5 MG tablet; Take 1 tablet by mouth daily.  . Reviewed expectations re: course of current medical issues. . Discussed self-management of symptoms. . Outlined signs and symptoms indicating need for more acute intervention. . Patient verbalized understanding and all questions were answered. . See orders for this visit as documented in the electronic medical record. . Patient received an After Visit Summary.  CMA or LPN served as scribe during this visit. History, Physical, and Plan performed by medical provider. The above documentation has been reviewed and is accurate and complete.  [name], PA-C [place], [place] 08/28/2019  Follow-up: No follow-ups on file.

## 2019-09-01 ENCOUNTER — Encounter: Payer: Self-pay | Admitting: Physician Assistant

## 2019-09-15 ENCOUNTER — Encounter: Payer: Self-pay | Admitting: Physician Assistant

## 2019-09-15 ENCOUNTER — Other Ambulatory Visit: Payer: Self-pay

## 2019-09-15 ENCOUNTER — Ambulatory Visit (INDEPENDENT_AMBULATORY_CARE_PROVIDER_SITE_OTHER): Payer: BC Managed Care – PPO | Admitting: Physician Assistant

## 2019-09-15 VITALS — BP 138/88 | HR 82 | Temp 98.1°F | Ht 64.0 in | Wt 181.6 lb

## 2019-09-15 DIAGNOSIS — R519 Headache, unspecified: Secondary | ICD-10-CM | POA: Diagnosis not present

## 2019-09-15 DIAGNOSIS — I10 Essential (primary) hypertension: Secondary | ICD-10-CM

## 2019-09-15 NOTE — Patient Instructions (Addendum)
In the general population, pharmacologic treatment should be initiated when blood pressure is 150/90 mm Hg or higher in adults 60 years and older, or 140/90 mm Hg or higher in adults younger than 60 years.  Start Pravastatin 10 mg  Continue current medication regimen for your blood pressure.  Check no more than twice a week, about 30 min to an hour after medication. AND if you ever feel "off"  You will be contacted about your referral to neurology  -Aldona Bar

## 2019-09-15 NOTE — Progress Notes (Signed)
Alison Mahoney is a 59 y.o. female is here to discuss: hypertension  I acted as a Education administrator for [employer], PA-C [facility], [place]  History of Present Illness:   Chief Complaint  Patient presents with  . Hypertension    HPI  Hypertension Pt following up on blood pressure. Pt currently takes losartan-HCTZ 50-12.5 MG daily. Pt checks BP readings at home. Readings are around usually in the 130's over 90. Pt complains of occasional HA. Denies dizziness, SOB, chest pain or visual changes.  BP Readings from Last 3 Encounters:  09/15/19 138/88  08/28/19 134/90  08/10/19 138/76   Doesn't have concerns for sleep apnea. Doesn't wake up in the middle of the night, coughing, waking  Headaches Has tried multiple preventative medications including: topamax, elavil, beta-blocker. Has had all sorts of work-up at headache wellness centers. Has not had good relief.    Health Maintenance Due  Topic Date Due  . MAMMOGRAM  05/18/2018  . INFLUENZA VACCINE  09/13/2019    Past Medical History:  Diagnosis Date  . Ankylosing spondylitis ([place])    35 years ago  . Hyperlipidemia   . Hypertension   . Migraines      Social History   Tobacco Use  . Smoking status: Never Smoker  . Smokeless tobacco: Never Used  Vaping Use  . Vaping Use: Never used  Substance Use Topics  . Alcohol use: Not Currently  . Drug use: Not Currently    Past Surgical History:  Procedure Laterality Date  . ABDOMINAL HYSTERECTOMY  1988  . lt hand surgery  1985  . Lt middle toe surgery  1977  . Rt thumb joint replacement  1982  . vaginal delivery x3  1979, 1982, 1986    Family History  Problem Relation Age of Onset  . Arthritis Mother   . Alzheimer's disease Mother   . Arthritis Father   . Diabetes Brother   . Healthy Daughter   . Healthy Son   . Cancer Maternal Grandmother   . Cancer Paternal Grandmother   . Cancer Paternal Grandfather   . Diabetes Paternal Grandfather   . Healthy Son      PMHx, SurgHx, SocialHx, FamHx, Medications, and Allergies were reviewed in the Visit Navigator and updated as appropriate.   Patient Active Problem List   Diagnosis Date Noted  . 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 Hyperlipidemia 08/07/2019  . HTN (hypertension) 04/13/2016  . Migraine 04/13/2016    Social History   Tobacco Use  . Smoking status: Never Smoker  . Smokeless tobacco: Never Used  Vaping Use  . Vaping Use: Never used  Substance Use Topics  . Alcohol use: Not Currently  . Drug use: Not Currently    Current Medications and Allergies:    Current Outpatient Medications:  .  b complex vitamins tablet, Take 1 tablet by mouth daily., Disp: , Rfl:  .  BIOTIN PO, Take by mouth daily., Disp: , Rfl:  .  co-enzyme Q-10 30 MG capsule, Take 30 mg by mouth daily., Disp: , Rfl:  .  LORazepam (ATIVAN) 0.5 MG tablet, Take 1 tablet (0.5 mg total) by mouth 2 (two) times daily., Disp: 60 tablet, Rfl: 1 .  losartan-hydrochlorothiazide (HYZAAR) 50-12.5 MG tablet, Take 1 tablet by mouth daily., Disp: 30 tablet, Rfl: 1 .  naproxen sodium (ALEVE) 220 MG tablet, Take 220 mg by mouth as needed., Disp: , Rfl:  .  OVER THE COUNTER MEDICATION, Take 2 tablets by mouth daily. Cardio Tonic  BP, Disp: , Rfl:  .  pravastatin (PRAVACHOL) 10 MG tablet, Take 1 tablet (10 mg total) by mouth daily., Disp: 90 tablet, Rfl: 1 .  SUMAtriptan (IMITREX) 100 MG tablet, Take 1 tablet (100 mg total) by mouth every 2 (two) hours as needed for migraine. As needed for Migraines, Disp: 10 tablet, Rfl: 3 .  triamcinolone cream (KENALOG) 0.1 %, Apply to affected area 1-2 times daily, Disp: 30 g, Rfl: 0   Allergies  Allergen Reactions  . Inderal [Propranolol] Rash  . Tomato Rash    Review of Systems   ROS Negative unless otherwise specified per HPI.  Vitals:   Vitals:   09/15/19 1055  BP: 138/88  Pulse: 82  Temp: 98.1 F (36.7 C)  TempSrc: Temporal  SpO2: 98%  Weight: 181 lb 9.6 oz (82.4 kg)  Height: 5\' 4"   (1.626 m)     Body mass index is 31.17 kg/m.   Physical Exam:    Physical Exam Vitals and nursing note reviewed.  Constitutional:      General: She is not in acute distress.    Appearance: She is well-developed. She is not ill-appearing or toxic-appearing.  Cardiovascular:     Rate and Rhythm: Normal rate and regular rhythm.     Pulses: Normal pulses.     Heart sounds: Normal heart sounds, S1 normal and S2 normal.     Comments: No LE edema Pulmonary:     Effort: Pulmonary effort is normal.     Breath sounds: Normal breath sounds.  Skin:    General: Skin is warm and dry.  Neurological:     Mental Status: She is alert.     GCS: GCS eye subscore is 4. GCS verbal subscore is 5. GCS motor subscore is 6.  Psychiatric:        Speech: Speech normal.        Behavior: Behavior normal. Behavior is cooperative.      Assessment and Plan:    Alison Mahoney was seen today for hypertension.  Diagnoses and all orders for this visit:  Frequent headaches Referral to Dr. Jaynee Eagles for possible botox. -     Ambulatory referral to Neurology  Essential hypertension Currently well controlled. Continue Losartan-HCTZ 50-12.5 mg. Follow-up in 3 months, sooner if concerns.  . Reviewed expectations re: course of current medical issues. . Discussed self-management of symptoms. . Outlined signs and symptoms indicating need for more acute intervention. . Patient verbalized understanding and all questions were answered. . See orders for this visit as documented in the electronic medical record. . Patient received an After Visit Summary.  CMA or LPN served as scribe during this visit. History, Physical, and Plan performed by medical provider. The above documentation has been reviewed and is accurate and complete.  [name], PA-C [place], [place] 09/15/2019  Follow-up: No follow-ups on file.

## 2019-09-16 ENCOUNTER — Ambulatory Visit: Payer: BC Managed Care – PPO | Admitting: Physician Assistant

## 2019-10-18 ENCOUNTER — Other Ambulatory Visit: Payer: Self-pay | Admitting: Physician Assistant

## 2019-11-04 ENCOUNTER — Ambulatory Visit: Payer: BC Managed Care – PPO | Admitting: Rheumatology

## 2019-11-23 ENCOUNTER — Encounter: Payer: Self-pay | Admitting: Neurology

## 2019-11-23 ENCOUNTER — Ambulatory Visit (INDEPENDENT_AMBULATORY_CARE_PROVIDER_SITE_OTHER): Payer: BC Managed Care – PPO | Admitting: Neurology

## 2019-11-23 ENCOUNTER — Other Ambulatory Visit: Payer: Self-pay

## 2019-11-23 VITALS — BP 142/98 | HR 93 | Ht 64.0 in | Wt 186.0 lb

## 2019-11-23 DIAGNOSIS — G43709 Chronic migraine without aura, not intractable, without status migrainosus: Secondary | ICD-10-CM | POA: Diagnosis not present

## 2019-11-23 DIAGNOSIS — G43109 Migraine with aura, not intractable, without status migrainosus: Secondary | ICD-10-CM

## 2019-11-23 MED ORDER — AJOVY 225 MG/1.5ML ~~LOC~~ SOSY: 225.0000 mg | PREFILLED_SYRINGE | SUBCUTANEOUS | 4 refills | Status: DC

## 2019-11-23 NOTE — Progress Notes (Signed)
[facility]    Provider:  Dr Jaynee Eagles Requesting Provider: [name], PA Primary Care Provider:  [name], PA  CC:  [place].   HPI:  Alison Mahoney is a 59 y.o. female here as requested by [name], PA for frequent headaches.PMHx migraines, HTN, HD, Ankylosing spondylitis.  I reviewed Alison Mahoney's notes: She has tried multiple preventatives, had "all sorts of work up" at the [facility], has not had good relief. Tried: Topamax, elavil, b-blocker. From a thorough review of records, meds tried include: flexeril, diclofenac, naproxen, imitrex,inderal,  topamax, b-blocker, elevil. After review of Epic and Care Everywhere I do not see any brain imaging available.   She has had migraines since high school, they have not improved in menopause, stable frequency and severity, over years she has had many medications, Hers can last 2-3 days, Imitrex works the best acutely, right side mostly can also be on the left side, around the eye above the eye, they feel hot, throbbing, searing, photophobia the most, phonophobia, nausea, movement makes it worse, sometimes she can;t get out of the bed, a cold cloth helps. They can be moderately severe to severe. At least 15 migraine days a month. No medication overuse. She does not have aura frequently, maybe some word-finding problems. She can wake with headaches, she can have vision changes. But she does not think she has any new qualities, No other focal neurologic deficits, associated symptoms, inciting events or modifiable factors.  Reviewed notes, labs and imaging from outside physicians, which showed:  Cbc normal, CMP unremarkable(low platelets) 04/2019  Review of Systems: Patient complains of symptoms per HPI as well as the following symptoms: headaches. Pertinent negatives and positives per HPI. All others negative.   Social History   Socioeconomic History  . Marital status: Single    Spouse name:  Not on file  . Number of children: 3  . Years of education: Not on file  . Highest education level: Not on file  Occupational History  . Not on file  Tobacco Use  . Smoking status: Never Smoker  . Smokeless tobacco: Never Used  Vaping Use  . Vaping Use: Never used  Substance and Sexual Activity  . Alcohol use: Not Currently  . Drug use: Not Currently  . Sexual activity: Not Currently  Other Topics Concern  . Not on file  Social History Narrative   Moved from [place] in [place]; 3 children   Currently works as a Education officer, museum      Lives alone   Right handed   Caffeine: 1 cup/day, rarely may have a second cup   Social Determinants of Radio broadcast assistant Strain:   . Difficulty of Paying Living Expenses: Not on file  Food Insecurity:   . Worried About Charity fundraiser in the Last Year: Not on file  . Ran Out of Food in the Last Year: Not on file  Transportation Needs:   . Lack of Transportation (Medical): Not on file  . Lack of Transportation (Non-Medical): Not on file  Physical Activity:   . Days of Exercise per Week: Not on file  . Minutes of Exercise per Session: Not on file  Stress:   . Feeling of Stress : Not on file  Social Connections:   . Frequency of Communication with Friends and Family: Not on file  . Frequency of Social Gatherings with Friends and Family: Not on file  . Attends Religious Services: Not on file  .  Active Member of Clubs or Organizations: Not on file  . Attends Archivist Meetings: Not on file  . Marital Status: Not on file  Intimate Partner Violence:   . Fear of Current or Ex-Partner: Not on file  . Emotionally Abused: Not on file  . Physically Abused: Not on file  . Sexually Abused: Not on file    Family History  Problem Relation Age of Onset  . Arthritis Mother   . Alzheimer's disease Mother   . Arthritis Father   . Diabetes Brother   . Healthy Daughter   . Healthy Son   . Cancer Maternal  Grandmother   . Cancer Paternal Grandmother   . Cancer Paternal Grandfather   . Diabetes Paternal Grandfather   . Healthy Son     Past Medical History:  Diagnosis Date  . Ankylosing spondylitis ([place])    35 years ago  . Hyperlipidemia   . Hypertension   . Migraines     Patient Active Problem List   Diagnosis Date Noted  . 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 Hyperlipidemia 08/07/2019  . HTN (hypertension) 04/13/2016  . Migraine 04/13/2016    Past Surgical History:  Procedure Laterality Date  . ABDOMINAL HYSTERECTOMY  1988  . lt hand surgery  1985  . Lt middle toe surgery  1977  . OOPHORECTOMY Bilateral 08/05/2019   also removed tumors from L & R ovary; benign   . ovarian tumor removal Left 08/05/2019   2 lb, benign; @ [facility]  . Rt thumb joint replacement  1982  . SALPINGECTOMY Bilateral 08/05/2019  . vaginal delivery x3  1979, 1982, 1986    Current Outpatient Medications  Medication Sig Dispense Refill  . b complex vitamins tablet Take 1 tablet by mouth daily.    Marland Kitchen BIOTIN PO Take by mouth daily.    Marland Kitchen co-enzyme Q-10 30 MG capsule Take 30 mg by mouth daily.    . IVERMECTIN EX Apply topically. To face twice daily.    Marland Kitchen losartan-hydrochlorothiazide (HYZAAR) 50-12.5 MG tablet TAKE 1 TABLET BY MOUTH EVERY DAY 30 tablet 1  . naproxen sodium (ALEVE) 220 MG tablet Take 220 mg by mouth as needed.    Marland Kitchen OVER THE COUNTER MEDICATION Take 2 tablets by mouth daily. Cardio Tonic BP    . pravastatin (PRAVACHOL) 10 MG tablet Take 1 tablet (10 mg total) by mouth daily. 90 tablet 1  . SUMAtriptan (IMITREX) 100 MG tablet Take 1 tablet (100 mg total) by mouth every 2 (two) hours as needed for migraine. As needed for Migraines 10 tablet 3  . Fremanezumab-vfrm (AJOVY) 225 MG/1.5ML SOSY Inject 225 mg into the skin every 30 (thirty) days. 4.5 mL 4   No current facility-administered medications for this visit.    Allergies as of 11/23/2019 - Review Complete 11/23/2019  Allergen Reaction Noted  .  Inderal [propranolol] Rash 04/14/2018  . Tomato Rash 07/28/2019    Vitals: BP (!) 142/98 (BP Location: Left Arm, Patient Position: Sitting)   Pulse 93   Ht 5\' 4"  (1.626 m)   Wt 186 lb (84.4 kg)   BMI 31.93 kg/m  Last Weight:  Wt Readings from Last 1 Encounters:  11/23/19 186 lb (84.4 kg)   Last Height:   Ht Readings from Last 1 Encounters:  11/23/19 5\' 4"  (1.626 m)     Physical exam: Exam: Gen: NAD, conversant, well nourised, obese, well groomed  CV: RRR, no MRG. No Carotid Bruits. No peripheral edema, warm, nontender Eyes: Conjunctivae clear without exudates or hemorrhage  Neuro: Detailed Neurologic Exam  Speech:    Speech is normal; fluent and spontaneous with normal comprehension.  Cognition:    The patient is oriented to person, place, and time;     recent and remote memory intact;     language fluent;     normal attention, concentration,     fund of knowledge Cranial Nerves:    The pupils are equal, round, and reactive to light. The fundi are normal and spontaneous venous pulsations are present. Visual fields are full to finger confrontation. Extraocular movements are intact. Trigeminal sensation is intact and the muscles of mastication are normal. The face is symmetric. The palate elevates in the midline. Hearing intact. Voice is normal. Shoulder shrug is normal. The tongue has normal motion without fasciculations.   Coordination:    Normal finger to nose and heel to shin. Normal rapid alternating movements.   Gait:    Heel-toe and tandem gait are normal.   Motor Observation:    No asymmetry, no atrophy, and no involuntary movements noted. Tone:    Normal muscle tone.    Posture:    Posture is normal. normal erect    Strength:    Strength is V/V in the upper and lower limbs.      Sensation: intact to LT     Reflex Exam:  DTR's:    Deep tendon reflexes in the upper and lower extremities are normal bilaterally.   Toes:    The  toes are downgoing bilaterally.   Clonus:    Clonus is absent.    Assessment/Plan:   59 y.o. female here as requested by [name], PA for frequent headaches.PMHx migraines, HTN, HD, Ankylosing spondylitis. She has had migraines for decades and failed multiple oral medications.No indication for imaging at this time.   We discussed options, for acute management she does well with imitrex. For preventative Ajovy. We also discussed botox.    Meds ordered this encounter  Medications  . Fremanezumab-vfrm (AJOVY) 225 MG/1.5ML SOSY    Sig: Inject 225 mg into the skin every 30 (thirty) days.    Dispense:  4.5 mL    Refill:  4    Patient has copay card; she can have medication for $5 regardless of insurance approval or copay amount.    Cc: [name], PA,  [place], [place], [place]  Sarina Ill, MD  [facility] [address] [place], [place] [address]  Phone [phone] Fax [phone]

## 2019-11-23 NOTE — Patient Instructions (Signed)
Continue Imitrex Start Ajovy   There is increased risk for stroke in women with migraine with aura and a contraindication for the combined contraceptive pill for use by women who have migraine with aura. The risk for women with migraine without aura is lower. However other risk factors like smoking are far more likely to increase stroke risk than migraine. There is a recommendation for no smoking and for the use of OCPs without estrogen such as progestogen only pills particularly for women with migraine with aura.Marland Kitchen People who have migraine headaches with auras may be 3 times more likely to have a stroke caused by a blood clot, compared to migraine patients who don't see auras. Women who take hormone-replacement therapy may be 30 percent more likely to suffer a clot-based stroke than women not taking medication containing estrogen. Other risk factors like smoking and high blood pressure may be  much more important.  Fremanezumab injection What is this medicine? FREMANEZUMAB (fre ma NEZ ue mab) is used to prevent migraine headaches. This medicine may be used for other purposes; ask your health care provider or pharmacist if you have questions. COMMON BRAND NAME(S): AJOVY What should I tell my health care provider before I take this medicine? They need to know if you have any of these conditions:  an unusual or allergic reaction to fremanezumab, other medicines, foods, dyes, or preservatives  pregnant or trying to get pregnant  breast-feeding How should I use this medicine? This medicine is for injection under the skin. You will be taught how to prepare and give this medicine. Use exactly as directed. Take your medicine at regular intervals. Do not take your medicine more often than directed. It is important that you put your used needles and syringes in a special sharps container. Do not put them in a trash can. If you do not have a sharps container, call your pharmacist or healthcare provider to  get one. Talk to your pediatrician regarding the use of this medicine in children. Special care may be needed. Overdosage: If you think you have taken too much of this medicine contact a poison control center or emergency room at once. NOTE: This medicine is only for you. Do not share this medicine with others. What if I miss a dose? If you miss a dose, take it as soon as you can. If it is almost time for your next dose, take only that dose. Do not take double or extra doses. What may interact with this medicine? Interactions are not expected. This list may not describe all possible interactions. Give your health care provider a list of all the medicines, herbs, non-prescription drugs, or dietary supplements you use. Also tell them if you smoke, drink alcohol, or use illegal drugs. Some items may interact with your medicine. What should I watch for while using this medicine? Tell your doctor or healthcare professional if your symptoms do not start to get better or if they get worse. What side effects may I notice from receiving this medicine? Side effects that you should report to your doctor or health care professional as soon as possible:  allergic reactions like skin rash, itching or hives, swelling of the face, lips, or tongue Side effects that usually do not require medical attention (report these to your doctor or health care professional if they continue or are bothersome):  pain, redness, or irritation at site where injected This list may not describe all possible side effects. Call your doctor for medical advice about side  effects. You may report side effects to FDA at 1-800-FDA-1088. Where should I keep my medicine? Keep out of the reach of children. You will be instructed on how to store this medicine. Throw away any unused medicine after the expiration date on the label. NOTE: This sheet is a summary. It may not cover all possible information. If you have questions about this  medicine, talk to your doctor, pharmacist, or health care provider.  2020 Elsevier/Gold Standard (2016-10-29 17:22:56)

## 2019-12-04 ENCOUNTER — Encounter: Payer: Self-pay | Admitting: Physician Assistant

## 2019-12-08 ENCOUNTER — Telehealth: Payer: Self-pay | Admitting: Neurology

## 2019-12-08 NOTE — Telephone Encounter (Signed)
Completed Ajovy PA on Cover My Meds. Key: [id number]. Awaiting determination from [place].

## 2019-12-08 NOTE — Telephone Encounter (Signed)
Pt called, Pharmacy said cannot fill Fremanezumab-vfrm (AJOVY) 225 MG/1.5ML SOSY need a form filled out to verify medication is medically necessary. Pt stated, pharmacy will be sending a form to have filled out. Would like a call from the nurse.

## 2019-12-09 NOTE — Telephone Encounter (Signed)
Per Cover My Meds, [employer] has approved Ajovy from 12/08/2019 - 03/09/2020. I called the pt and LVM (ok per DPR) advising of approval and to call her pharmacy to arrange pickup. I called [facility] and advised staff of the approval. They will fill this for the patient.

## 2019-12-16 ENCOUNTER — Ambulatory Visit (INDEPENDENT_AMBULATORY_CARE_PROVIDER_SITE_OTHER): Payer: BC Managed Care – PPO | Admitting: Physician Assistant

## 2019-12-16 ENCOUNTER — Encounter: Payer: Self-pay | Admitting: Physician Assistant

## 2019-12-16 ENCOUNTER — Other Ambulatory Visit: Payer: Self-pay

## 2019-12-16 VITALS — BP 110/72 | HR 74 | Temp 97.2°F | Ht 64.0 in | Wt 188.0 lb

## 2019-12-16 DIAGNOSIS — I10 Essential (primary) hypertension: Secondary | ICD-10-CM

## 2019-12-16 DIAGNOSIS — E785 Hyperlipidemia, unspecified: Secondary | ICD-10-CM

## 2019-12-16 DIAGNOSIS — E559 Vitamin D deficiency, unspecified: Secondary | ICD-10-CM | POA: Diagnosis not present

## 2019-12-16 MED ORDER — SUMATRIPTAN SUCCINATE 100 MG PO TABS: 100.0000 mg | ORAL_TABLET | ORAL | 3 refills | Status: DC | PRN

## 2019-12-16 NOTE — Patient Instructions (Signed)
It was great to see you!  We will be in touch with your lab results.  I have refilled your imitrex.  Let's follow-up in 6 months, sooner if you have concerns.  Take care,  [name] PA-C

## 2019-12-16 NOTE — Progress Notes (Signed)
Alison Mahoney is a 59 y.o. female is here for follow up.  I acted as a Education administrator for [employer], PA-C Anselmo Pickler, LPN   History of Present Illness:   Chief Complaint  Patient presents with  . Hypertension  . Hyperlipidemia    HPI   Hypertension Pt currently taking Hyzaar 50-12.5 mg daily. She is not checking blood pressure at home. Pt denies, dizziness, blurred vision, chest pain, SOB or lower leg edema. Pt has only one Migraine since here last. Denies excessive caffeine intake, stimulant usage, excessive alcohol intake or increase in salt consumption.  Hyperlipidemia Pt is currently taking Pravastatin 10 mg daily. Denies leg cramps.  Vit D deficiency Has a strong family history of Vit D deficiency and suspects that she requires supplementation.  She would like this checked today. She currently does not take any supplementation.   Health Maintenance Due  Topic Date Due  . MAMMOGRAM  05/18/2018    Past Medical History:  Diagnosis Date  . Ankylosing spondylitis ([place])    35 years ago  . Hyperlipidemia   . Hypertension   . Migraines      Social History   Tobacco Use  . Smoking status: Never Smoker  . Smokeless tobacco: Never Used  Vaping Use  . Vaping Use: Never used  Substance Use Topics  . Alcohol use: Not Currently  . Drug use: Not Currently    Past Surgical History:  Procedure Laterality Date  . ABDOMINAL HYSTERECTOMY  1988  . lt hand surgery  1985  . Lt middle toe surgery  1977  . OOPHORECTOMY Bilateral 08/05/2019   also removed tumors from L & R ovary; benign   . ovarian tumor removal Left 08/05/2019   2 lb, benign; @ [facility]  . Rt thumb joint replacement  1982  . SALPINGECTOMY Bilateral 08/05/2019  . vaginal delivery x3  1979, 1982, 1986    Family History  Problem Relation Age of Onset  . Arthritis Mother   . Alzheimer's disease Mother   . Arthritis Father   . Diabetes Brother   . Healthy Daughter   . Healthy Son   . Cancer Maternal  Grandmother   . Cancer Paternal Grandmother   . Cancer Paternal Grandfather   . Diabetes Paternal Grandfather   . Healthy Son     PMHx, SurgHx, SocialHx, FamHx, Medications, and Allergies were reviewed in the Visit Navigator and updated as appropriate.   Patient Active Problem List   Diagnosis Date Noted  . 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 Hyperlipidemia 08/07/2019  . HTN (hypertension) 04/13/2016  . Migraine 04/13/2016    Social History   Tobacco Use  . Smoking status: Never Smoker  . Smokeless tobacco: Never Used  Vaping Use  . Vaping Use: Never used  Substance Use Topics  . Alcohol use: Not Currently  . Drug use: Not Currently    Current Medications and Allergies:    Current Outpatient Medications:  .  b complex vitamins tablet, Take 1 tablet by mouth daily., Disp: , Rfl:  .  BIOTIN PO, Take by mouth daily., Disp: , Rfl:  .  co-enzyme Q-10 30 MG capsule, Take 30 mg by mouth daily., Disp: , Rfl:  .  Fremanezumab-vfrm (AJOVY) 225 MG/1.5ML SOSY, Inject 225 mg into the skin every 30 (thirty) days., Disp: 4.5 mL, Rfl: 4 .  IVERMECTIN EX, Apply topically. To face twice daily., Disp: , Rfl:  .  losartan-hydrochlorothiazide (HYZAAR) 50-12.5 MG tablet, TAKE 1 TABLET BY MOUTH  EVERY DAY, Disp: 30 tablet, Rfl: 1 .  naproxen sodium (ALEVE) 220 MG tablet, Take 220 mg by mouth as needed., Disp: , Rfl:  .  OVER THE COUNTER MEDICATION, Take 2 tablets by mouth daily. Cardio Tonic BP, Disp: , Rfl:  .  pravastatin (PRAVACHOL) 10 MG tablet, Take 1 tablet (10 mg total) by mouth daily., Disp: 90 tablet, Rfl: 1 .  SUMAtriptan (IMITREX) 100 MG tablet, Take 1 tablet (100 mg total) by mouth every 2 (two) hours as needed for migraine. As needed for Migraines, Disp: 10 tablet, Rfl: 3   Allergies  Allergen Reactions  . Inderal [Propranolol] Rash  . Tomato Rash    Review of Systems   ROS  Negative unless otherwise specified per HPI.  Vitals:   Vitals:   12/16/19 0736  BP: 110/72  Pulse: 74   Temp: (!) 97.2 F (36.2 C)  TempSrc: Temporal  SpO2: 97%  Weight: 188 lb (85.3 kg)  Height: 5\' 4"  (1.626 m)     Body mass index is 32.27 kg/m.   Physical Exam:    Physical Exam Vitals and nursing note reviewed.  Constitutional:      General: She is not in acute distress.    Appearance: She is well-developed. She is not ill-appearing or toxic-appearing.  Cardiovascular:     Rate and Rhythm: Normal rate and regular rhythm.     Pulses: Normal pulses.     Heart sounds: Normal heart sounds, S1 normal and S2 normal.     Comments: No LE edema Pulmonary:     Effort: Pulmonary effort is normal.     Breath sounds: Normal breath sounds.  Skin:    General: Skin is warm and dry.  Neurological:     Mental Status: She is alert.     GCS: GCS eye subscore is 4. GCS verbal subscore is 5. GCS motor subscore is 6.  Psychiatric:        Speech: Speech normal.        Behavior: Behavior normal. Behavior is cooperative.      Assessment and Plan:    Basma was seen today for hypertension and hyperlipidemia.  Diagnoses and all orders for this visit:  Hyperlipidemia, unspecified hyperlipidemia type Update lipid panel today. Will adjust pravastatin 10 mg prn. -     Lipid panel; Future -     Lipid panel  Essential hypertension Well controlled. Continue losartan-hctz 50-12.5 mg. Follow-up in 6 months. -     Comprehensive metabolic panel; Future -     Comprehensive metabolic panel  Vitamin D deficiency Update Vit D lab today. Will provide supplement recommendations as indicated. -     VITAMIN D 25 Hydroxy (Vit-D Deficiency, Fractures); Future -     VITAMIN D 25 Hydroxy (Vit-D Deficiency, Fractures)  Other orders -     SUMAtriptan (IMITREX) 100 MG tablet; Take 1 tablet (100 mg total) by mouth every 2 (two) hours as needed for migraine. As needed for Migraines   CMA or LPN served as scribe during this visit. History, Physical, and Plan performed by medical provider. The above  documentation has been reviewed and is accurate and complete.  [name], PA-C [place] [place] 12/16/2019  Follow-up: No follow-ups on file.

## 2019-12-17 ENCOUNTER — Other Ambulatory Visit: Payer: Self-pay | Admitting: Physician Assistant

## 2019-12-17 LAB — COMPREHENSIVE METABOLIC PANEL
AG Ratio: 1.9 (calc) (ref 1.0–2.5)
ALT: 11 U/L (ref 6–29)
AST: 16 U/L (ref 10–35)
Albumin: 4.4 g/dL (ref 3.6–5.1)
Alkaline phosphatase (APISO): 76 U/L (ref 37–153)
BUN: 11 mg/dL (ref 7–25)
CO2: 33 mmol/L — ABNORMAL HIGH (ref 20–32)
Calcium: 9.8 mg/dL (ref 8.6–10.4)
Chloride: 104 mmol/L (ref 98–110)
Creat: 0.73 mg/dL (ref 0.50–0.99)
Globulin: 2.3 g/dL (calc) (ref 1.9–3.7)
Glucose, Bld: 115 mg/dL — ABNORMAL HIGH (ref 65–99)
Potassium: 4.3 mmol/L (ref 3.5–5.3)
Sodium: 142 mmol/L (ref 135–146)
Total Bilirubin: 0.6 mg/dL (ref 0.2–1.2)
Total Protein: 6.7 g/dL (ref 6.1–8.1)

## 2019-12-17 LAB — LIPID PANEL
Cholesterol: 217 mg/dL — ABNORMAL HIGH (ref <200)
HDL: 49 mg/dL — ABNORMAL LOW (ref >=50)
LDL Cholesterol (Calc): 138 mg/dL (calc) — ABNORMAL HIGH
Non-HDL Cholesterol (Calc): 168 mg/dL (calc) — ABNORMAL HIGH (ref <130)
Total CHOL/HDL Ratio: 4.4 (calc) (ref <5.0)
Triglycerides: 163 mg/dL — ABNORMAL HIGH (ref <150)

## 2019-12-17 LAB — VITAMIN D 25 HYDROXY (VIT D DEFICIENCY, FRACTURES): Vit D, 25-Hydroxy: 15 ng/mL — ABNORMAL LOW (ref 30–100)

## 2019-12-17 MED ORDER — VITAMIN D (ERGOCALCIFEROL) 1.25 MG (50000 UNIT) PO CAPS: 50000.0000 [IU] | ORAL_CAPSULE | ORAL | 0 refills | Status: DC

## 2019-12-17 MED ORDER — PRAVASTATIN SODIUM 40 MG PO TABS: 40.0000 mg | ORAL_TABLET | Freq: Every day | ORAL | 2 refills | Status: DC

## 2019-12-18 ENCOUNTER — Other Ambulatory Visit: Payer: Self-pay | Admitting: Physician Assistant

## 2020-01-10 ENCOUNTER — Other Ambulatory Visit: Payer: Self-pay | Admitting: Physician Assistant

## 2020-01-11 ENCOUNTER — Other Ambulatory Visit: Payer: Self-pay | Admitting: Physician Assistant

## 2020-02-03 ENCOUNTER — Other Ambulatory Visit: Payer: Self-pay | Admitting: Physician Assistant

## 2020-02-04 ENCOUNTER — Other Ambulatory Visit: Payer: Self-pay | Admitting: Physician Assistant

## 2020-02-26 ENCOUNTER — Other Ambulatory Visit: Payer: Self-pay | Admitting: Physician Assistant

## 2020-03-07 ENCOUNTER — Other Ambulatory Visit: Payer: Self-pay | Admitting: Physician Assistant

## 2020-03-07 ENCOUNTER — Telehealth: Payer: Self-pay | Admitting: Neurology

## 2020-03-07 NOTE — Telephone Encounter (Signed)
Spoke with patient. She will try to download a coupon on the Ajovy website and if she has any trouble, the patient will call tomorrow and request for a coupon to be mailed to her. Pt confirmed her address.

## 2020-03-07 NOTE — Telephone Encounter (Signed)
Pt is asking if there is an available coupon for her Fremanezumab-vfrm (AJOVY) 225 MG/1.5ML SOSY , please call.

## 2020-03-15 ENCOUNTER — Telehealth: Payer: Self-pay | Admitting: *Deleted

## 2020-03-15 NOTE — Telephone Encounter (Signed)
Received Ajovy PA request on Cover My Meds. I called the pt and LVM (ok per DPR) asking for call back or mychart message letting us know if she likes the Ajovy, if it's reducing her migraine headache frequency and severity. Left office number in message.

## 2020-03-16 NOTE — Telephone Encounter (Signed)
Pt. called back & stated ajovy is working & is helpful.

## 2020-03-16 NOTE — Telephone Encounter (Signed)
Completed Ajovy renewal PA on Cover My Meds. Key: [id number]. Awaiting determination from [place] within 24 hours.

## 2020-03-17 ENCOUNTER — Telehealth: Payer: Self-pay

## 2020-03-17 MED ORDER — LOSARTAN POTASSIUM-HCTZ 50-12.5 MG PO TABS: 1.0000 | ORAL_TABLET | Freq: Every day | ORAL | 1 refills | Status: DC

## 2020-03-17 NOTE — Telephone Encounter (Signed)
Spoke to pt told her Rx sent to pharmacy as requested,but you do need to schedule a f/u on blood pressure. Pt verbalized understanding. Appt scheduled.

## 2020-03-17 NOTE — Telephone Encounter (Signed)
Per Cover My Meds, insurance approved 03/16/2020 - 03/16/2021.   Patient has been updated.

## 2020-03-17 NOTE — Telephone Encounter (Signed)
.   LAST APPOINTMENT DATE: 03/07/2020   NEXT APPOINTMENT DATE:@Visit  date not found  MEDICATION:losartan-hydrochlorothiazide (HYZAAR) 50-12.5 MG tablet  PHARMACY:[id number] - [place], [place] - [address]   CLINICAL FILLS OUT ALL BELOW:   LAST REFILL:  QTY:  REFILL DATE:    OTHER COMMENTS:  PATIENT IS REQUESTING A 90 supply instead of 30 Day   Okay for refill?  Please advise

## 2020-03-24 NOTE — Progress Notes (Addendum)
Chief Complaint  Patient presents with   Follow-up    Rm 1 alone  Pt is well, still have migraines about once a week or so but not as sever and frequent.      HISTORY OF PRESENT ILLNESS: 03/28/20 ALL:   Alison Mahoney is a 60 y.o. female here today for follow up for migraines. She continues Ajovy and sumatriptan. She feels that she is doing well. Baseline was 15-20 migraine days, now 3-5. Headaches are not as severe and do not last as long. She does say that she tends to wake up with migraines. Once she gets up and gets ready, headache resolves. She denies snoring. No daytime sleepiness or dry mouth. She does note that her blood pressure fluctuates. She is on losartan/hctz 50-12.5mg  (reduced from 100/25 in June, 2021). She is watching her diet closely.    HISTORY (copied from Dr Cathren Laine previous note)   HPI:  Alison Mahoney is a 60 y.o. female here as requested by [name], PA for frequent headaches.PMHx migraines, HTN, HD, Ankylosing spondylitis.  I reviewed [name]'s notes: She has tried multiple preventatives, had "all sorts of work up" at the [employer], has not had good relief. Tried: Topamax, elavil, b-blocker. From a thorough review of records, meds tried include: flexeril, diclofenac, naproxen, imitrex,inderal,  topamax, b-blocker, elevil. After review of Epic and Care Everywhere I do not see any brain imaging available.   She has had migraines since high school, they have not improved in menopause, stable frequency and severity, over years she has had many medications, Hers can last 2-3 days, Imitrex works the best acutely, right side mostly can also be on the left side, around the eye above the eye, they feel hot, throbbing, searing, photophobia the most, phonophobia, nausea, movement makes it worse, sometimes she can;t get out of the bed, a cold cloth helps. They can be moderately severe to severe. At least 15 migraine days a month. No medication  overuse. She does not have aura frequently, maybe some word-finding problems. She can wake with headaches, she can have vision changes. But she does not think she has any new qualities, No other focal neurologic deficits, associated symptoms, inciting events or modifiable factors.  Reviewed notes, labs and imaging from outside physicians, which showed:  Cbc normal, CMP unremarkable(low platelets) 04/2019   REVIEW OF SYSTEMS: Out of a complete 14 system review of symptoms, the patient complains only of the following symptoms, headaches and all other reviewed systems are negative.    ALLERGIES: Allergies  Allergen Reactions   Inderal [Propranolol] Rash   Tomato Rash     HOME MEDICATIONS: Outpatient Medications Prior to Visit  Medication Sig Dispense Refill   b complex vitamins tablet Take 1 tablet by mouth daily.     BIOTIN PO Take by mouth daily.     co-enzyme Q-10 30 MG capsule Take 30 mg by mouth daily.     Fremanezumab-vfrm (AJOVY) 225 MG/1.5ML SOSY Inject 225 mg into the skin every 30 (thirty) days. 4.5 mL 4   IVERMECTIN EX Apply topically. To face twice daily.     losartan-hydrochlorothiazide (HYZAAR) 50-12.5 MG tablet Take 1 tablet by mouth daily. 90 tablet 1   naproxen sodium (ALEVE) 220 MG tablet Take 220 mg by mouth as needed.     OVER THE COUNTER MEDICATION Take 2 tablets by mouth daily. Cardio Tonic BP     pravastatin (PRAVACHOL) 40 MG tablet Take 1 tablet (40 mg  total) by mouth daily. 90 tablet 2   SUMAtriptan (IMITREX) 100 MG tablet Take 1 tablet (100 mg total) by mouth every 2 (two) hours as needed for migraine. As needed for Migraines 10 tablet 3   Vitamin D, Ergocalciferol, (DRISDOL) 1.25 MG (50000 UNIT) CAPS capsule TAKE 1 CAPSULE (50,000 UNITS TOTAL) BY MOUTH EVERY 7 (SEVEN) DAYS 12 capsule 0   No facility-administered medications prior to visit.     PAST MEDICAL HISTORY: Past Medical History:  Diagnosis Date   Ankylosing spondylitis ([place])     35 years ago   Hyperlipidemia    Hypertension    Migraines      PAST SURGICAL HISTORY: Past Surgical History:  Procedure Laterality Date   ABDOMINAL HYSTERECTOMY  1988   lt hand surgery  1985   Lt middle toe surgery  1977   OOPHORECTOMY Bilateral 08/05/2019   also removed tumors from L & R ovary; benign    ovarian tumor removal Left 08/05/2019   2 lb, benign; @ [facility]   Rt thumb joint replacement  1982   SALPINGECTOMY Bilateral 08/05/2019   vaginal delivery x3  1979, 1982, 1986     FAMILY HISTORY: Family History  Problem Relation Age of Onset   Arthritis Mother    Alzheimer's disease Mother    Arthritis Father    Diabetes Brother    Healthy Daughter    Healthy Son    Cancer Maternal Grandmother    Cancer Paternal Grandmother    Cancer Paternal Grandfather    Diabetes Paternal Grandfather    Healthy Son      SOCIAL HISTORY: Social History   Socioeconomic History   Marital status: Single    Spouse name: Not on file   Number of children: 3   Years of education: Not on file   Highest education level: Not on file  Occupational History   Not on file  Tobacco Use   Smoking status: Never Smoker   Smokeless tobacco: Never Used  Scientific laboratory technician Use: Never used  Substance and Sexual Activity   Alcohol use: Not Currently   Drug use: Not Currently   Sexual activity: Not Currently  Other Topics Concern   Not on file  Social History Narrative   Moved from [place] in [place]; 3 children   Currently works as a Education officer, museum      Lives alone   Right handed   Caffeine: 1 cup/day, rarely may have a second cup   Social Determinants of Radio broadcast assistant Strain: Not on file  Food Insecurity: Not on file  Transportation Needs: Not on file  Physical Activity: Not on file  Stress: Not on file  Social Connections: Not on file  Intimate Partner Violence: Not on file      PHYSICAL EXAM  Vitals:    03/28/20 0719  BP: (!) 126/96  Pulse: 90  Weight: 194 lb (88 kg)  Height: 5\' 4"  (1.626 m)   Body mass index is 33.3 kg/m.   Generalized: Well developed, in no acute distress  Cardiology: normal rate and rhythm, no murmur auscultated  Respiratory: clear to auscultation bilaterally    Neurological examination  Mentation: Alert oriented to time, place, history taking. Follows all commands speech and language fluent Cranial nerve II-XII: Pupils were equal round reactive to light. Extraocular movements were full, visual field were full on confrontational test. Facial sensation and strength were normal. Head turning and shoulder shrug  were normal and  symmetric. Motor: The motor testing reveals 5 over 5 strength of all 4 extremities. Good symmetric motor tone is noted throughout.  Sensory: Sensory testing is intact to soft touch on all 4 extremities. No evidence of extinction is noted.  Coordination: Cerebellar testing reveals good finger-nose-finger and heel-to-shin bilaterally.  Gait and station: Gait is normal.  Reflexes: Deep tendon reflexes are symmetric and normal bilaterally.     DIAGNOSTIC DATA (LABS, IMAGING, TESTING) - I reviewed patient records, labs, notes, testing and imaging myself where available.  Lab Results  Component Value Date   WBC 5.8 04/29/2019   HGB 14.7 04/29/2019   HCT 44.9 04/29/2019   MCV 90.0 04/29/2019   PLT 131 (L) 04/29/2019      Component Value Date/Time   NA 142 12/16/2019 0804   K 4.3 12/16/2019 0804   CL 104 12/16/2019 0804   CO2 33 (H) 12/16/2019 0804   GLUCOSE 115 (H) 12/16/2019 0804   BUN 11 12/16/2019 0804   CREATININE 0.73 12/16/2019 0804   CALCIUM 9.8 12/16/2019 0804   PROT 6.7 12/16/2019 0804   ALBUMIN 4.6 12/03/2018 0917   AST 16 12/16/2019 0804   ALT 11 12/16/2019 0804   ALKPHOS 77 12/03/2018 0917   BILITOT 0.6 12/16/2019 0804   GFRNONAA 95 04/29/2019 1412   GFRAA 110 04/29/2019 1412   Lab Results  Component Value Date    CHOL 217 (H) 12/16/2019   HDL 49 (L) 12/16/2019   LDLCALC 138 (H) 12/16/2019   TRIG 163 (H) 12/16/2019   CHOLHDL 4.4 12/16/2019   No results found for: HGBA1C No results found for: VITAMINB12 No results found for: TSH  No flowsheet data found.   No flowsheet data found.   ASSESSMENT AND PLAN  60 y.o. year old female  has a past medical history of Ankylosing spondylitis ([place]), Hyperlipidemia, Hypertension, and Migraines. here with   Chronic migraine without aura without status migrainosus, not intractable  Athena is doing well, today. She reports improvement in severity and frequency of migraines on Ajovy and sumatriptan. We will continue current treatment plan. She will continue to monitor blood pressure closely at home. Healthy lifestyle habits and regular follow up with PCP advised. She will follow up with me in 1 year, sooner if needed.   No orders of the defined types were placed in this encounter.    No orders of the defined types were placed in this encounter.     I spent 20 minutes of face-to-face and non-face-to-face time with patient.  This included previsit chart review, lab review, study review, order entry, electronic health record documentation, patient education.    Debbora Presto, MSN, FNP-C 03/28/2020, 7:57 AM  [facility] [address], [place], [place] [address] [phone]   Made any corrections needed, and agree with history, physical, neuro exam,assessment and plan as stated.     Sarina Ill, MD [facility]

## 2020-03-28 ENCOUNTER — Ambulatory Visit: Payer: BC Managed Care – PPO | Admitting: Family Medicine

## 2020-03-28 ENCOUNTER — Encounter: Payer: Self-pay | Admitting: Family Medicine

## 2020-03-28 VITALS — BP 126/96 | HR 90 | Ht 64.0 in | Wt 194.0 lb

## 2020-03-28 DIAGNOSIS — G43709 Chronic migraine without aura, not intractable, without status migrainosus: Secondary | ICD-10-CM

## 2020-03-28 NOTE — Patient Instructions (Addendum)
Below is our plan:  We will continue Ajovy and sumatriptan.   Please make sure you are staying well hydrated. I recommend 50-60 ounces daily. Well balanced diet and regular exercise encouraged. Consistent sleep schedule with 6-8 hours recommended.   Please continue follow up with care team as directed.   Follow up in 1 year, sooner if needed   You may receive a survey regarding today's visit. I encourage you to leave honest feed back as I do use this information to improve patient care. Thank you for seeing me today!      Migraine Headache A migraine headache is a very strong throbbing pain on one side or both sides of your head. This type of headache can also cause other symptoms. It can last from 4 hours to 3 days. Talk with your doctor about what things may bring on (trigger) this condition. What are the causes? The exact cause of this condition is not known. This condition may be triggered or caused by:  Drinking alcohol.  Smoking.  Taking medicines, such as: ? Medicine used to treat chest pain (nitroglycerin). ? Birth control pills. ? Estrogen. ? Some blood pressure medicines.  Eating or drinking certain products.  Doing physical activity. Other things that may trigger a migraine headache include:  Having a menstrual period.  Pregnancy.  Hunger.  Stress.  Not getting enough sleep or getting too much sleep.  Weather changes.  Tiredness (fatigue). What increases the risk?  Being 67-73 years old.  Being female.  Having a family history of migraine headaches.  Being Caucasian.  Having depression or anxiety.  Being very overweight. What are the signs or symptoms?  A throbbing pain. This pain may: ? Happen in any area of the head, such as on one side or both sides. ? Make it hard to do daily activities. ? Get worse with physical activity. ? Get worse around bright lights or loud noises.  Other symptoms may include: ? Feeling sick to your stomach  (nauseous). ? Vomiting. ? Dizziness. ? Being sensitive to bright lights, loud noises, or smells.  Before you get a migraine headache, you may get warning signs (an aura). An aura may include: ? Seeing flashing lights or having blind spots. ? Seeing bright spots, halos, or zigzag lines. ? Having tunnel vision or blurred vision. ? Having numbness or a tingling feeling. ? Having trouble talking. ? Having weak muscles.  Some people have symptoms after a migraine headache (postdromal phase), such as: ? Tiredness. ? Trouble thinking (concentrating). How is this treated?  Taking medicines that: ? Relieve pain. ? Relieve the feeling of being sick to your stomach. ? Prevent migraine headaches.  Treatment may also include: ? Having acupuncture. ? Avoiding foods that bring on migraine headaches. ? Learning ways to control your body functions (biofeedback). ? Therapy to help you know and deal with negative thoughts (cognitive behavioral therapy). Follow these instructions at home: Medicines  Take over-the-counter and prescription medicines only as told by your doctor.  Ask your doctor if the medicine prescribed to you: ? Requires you to avoid driving or using heavy machinery. ? Can cause trouble pooping (constipation). You may need to take these steps to prevent or treat trouble pooping:  Drink enough fluid to keep your pee (urine) pale yellow.  Take over-the-counter or prescription medicines.  Eat foods that are high in fiber. These include beans, whole grains, and fresh fruits and vegetables.  Limit foods that are high in fat and sugar. These  include fried or sweet foods. Lifestyle  Do not drink alcohol.  Do not use any products that contain nicotine or tobacco, such as cigarettes, e-cigarettes, and chewing tobacco. If you need help quitting, ask your doctor.  Get at least 8 hours of sleep every night.  Limit and deal with stress. General instructions  Keep a journal to  find out what may bring on your migraine headaches. For example, write down: ? What you eat and drink. ? How much sleep you get. ? Any change in what you eat or drink. ? Any change in your medicines.  If you have a migraine headache: ? Avoid things that make your symptoms worse, such as bright lights. ? It may help to lie down in a dark, quiet room. ? Do not drive or use heavy machinery. ? Ask your doctor what activities are safe for you.  Keep all follow-up visits as told by your doctor. This is important.      Contact a doctor if:  You get a migraine headache that is different or worse than others you have had.  You have more than 15 headache days in one month. Get help right away if:  Your migraine headache gets very bad.  Your migraine headache lasts longer than 72 hours.  You have a fever.  You have a stiff neck.  You have trouble seeing.  Your muscles feel weak or like you cannot control them.  You start to lose your balance a lot.  You start to have trouble walking.  You pass out (faint).  You have a seizure. Summary  A migraine headache is a very strong throbbing pain on one side or both sides of your head. These headaches can also cause other symptoms.  This condition may be treated with medicines and changes to your lifestyle.  Keep a journal to find out what may bring on your migraine headaches.  Contact a doctor if you get a migraine headache that is different or worse than others you have had.  Contact your doctor if you have more than 15 headache days in a month. This information is not intended to replace advice given to you by your health care provider. Make sure you discuss any questions you have with your health care provider. Document Revised: 05/23/2018 Document Reviewed: 03/13/2018 Elsevier Patient Education  [place].

## 2020-04-08 ENCOUNTER — Other Ambulatory Visit: Payer: Self-pay

## 2020-04-08 ENCOUNTER — Encounter: Payer: Self-pay | Admitting: Physician Assistant

## 2020-04-08 ENCOUNTER — Ambulatory Visit: Payer: BC Managed Care – PPO | Admitting: Physician Assistant

## 2020-04-08 VITALS — BP 140/80 | HR 75 | Temp 98.2°F | Ht 64.0 in | Wt 192.0 lb

## 2020-04-08 DIAGNOSIS — N3946 Mixed incontinence: Secondary | ICD-10-CM

## 2020-04-08 DIAGNOSIS — E559 Vitamin D deficiency, unspecified: Secondary | ICD-10-CM | POA: Diagnosis not present

## 2020-04-08 DIAGNOSIS — E785 Hyperlipidemia, unspecified: Secondary | ICD-10-CM | POA: Diagnosis not present

## 2020-04-08 DIAGNOSIS — I10 Essential (primary) hypertension: Secondary | ICD-10-CM

## 2020-04-08 MED ORDER — LOSARTAN POTASSIUM-HCTZ 50-12.5 MG PO TABS
1.0000 | ORAL_TABLET | Freq: Every day | ORAL | 1 refills | Status: DC
Start: 2020-04-08 — End: 2020-04-11

## 2020-04-08 MED ORDER — LOSARTAN POTASSIUM-HCTZ 50-12.5 MG PO TABS: 1.0000 | ORAL_TABLET | Freq: Every day | ORAL | 1 refills | Status: DC

## 2020-04-08 NOTE — Progress Notes (Signed)
Alison Mahoney is a 60 y.o. female is here for follow up.  I acted as a Education administrator for [employer], PA-C [employer], LPN   History of Present Illness:   Chief Complaint  Patient presents with   Hypertension    HPI   Hypertension Pt currently taking Hyzaar 50-12.5 mg daily. She is not checking blood pressure at home. Pt denies, dizziness, blurred vision, chest pain, SOB or lower leg edema. Pt has only one Migraine since here last. Denies excessive caffeine intake, stimulant usage, excessive alcohol intake or increase in salt consumption.  BP Readings from Last 3 Encounters:  04/08/20 140/80  03/28/20 (!) 126/96  12/16/19 110/72   Spends a lot of time on the computer and screen time. Continues to see neurologist regularly for her migraines. Has had some increase in her HA since last seeing neurology but hasn't followed up on this with them yet. Denies: changes in vision, weakness on one side of the body, confusion.   Mixed incontinence urge and stress Pelvic floor PT has been placed in the past but she has been unable to go. She is hoping to restart this process. Continues to struggle with incontinence and will possibly start working outside of the home soon and would like to try to get this under better control.   HLD Currently taking pravachol 40 mg daily and tolerating well without any significant concerns. She would like to update her lipid panel.   Vitamin D Take 50000 IU weekly. Has been on this for a several months. Due for recheck.   Health Maintenance Due  Topic Date Due   MAMMOGRAM  05/18/2018    Past Medical History:  Diagnosis Date   Ankylosing spondylitis ([place])    35 years ago   Hyperlipidemia    Hypertension    Migraines      Social History   Tobacco Use   Smoking status: Never Smoker   Smokeless tobacco: Never Used  Vaping Use   Vaping Use: Never used  Substance Use Topics   Alcohol use: Not Currently   Drug use: Not Currently     Past Surgical History:  Procedure Laterality Date   ABDOMINAL HYSTERECTOMY  1988   lt hand surgery  1985   Lt middle toe surgery  1977   OOPHORECTOMY Bilateral 08/05/2019   also removed tumors from L & R ovary; benign    ovarian tumor removal Left 08/05/2019   2 lb, benign; @ [facility]   Rt thumb joint replacement  1982   SALPINGECTOMY Bilateral 08/05/2019   vaginal delivery x3  1979, 1982, 1986    Family History  Problem Relation Age of Onset   Arthritis Mother    Alzheimer's disease Mother    Arthritis Father    Diabetes Brother    Healthy Daughter    Healthy Son    Cancer Maternal Grandmother    Cancer Paternal Grandmother    Cancer Paternal Grandfather    Diabetes Paternal Grandfather    Healthy Son     PMHx, SurgHx, SocialHx, FamHx, Medications, and Allergies were reviewed in the Visit Navigator and updated as appropriate.   Patient Active Problem List   Diagnosis Date Noted   Vitamin D deficiency 04/08/2020   Mixed incontinence urge and stress 04/08/2020   Adnexal mass 08/07/2019   Hyperlipidemia 08/07/2019   HTN (hypertension) 04/13/2016   Migraine 04/13/2016    Social History   Tobacco Use   Smoking status: Never Smoker   Smokeless tobacco:  Never Used  Vaping Use   Vaping Use: Never used  Substance Use Topics   Alcohol use: Not Currently   Drug use: Not Currently    Current Medications and Allergies:    Current Outpatient Medications:    b complex vitamins tablet, Take 1 tablet by mouth daily., Disp: , Rfl:    BIOTIN PO, Take by mouth daily., Disp: , Rfl:    co-enzyme Q-10 30 MG capsule, Take 30 mg by mouth daily., Disp: , Rfl:    Fremanezumab-vfrm (AJOVY) 225 MG/1.5ML SOSY, Inject 225 mg into the skin every 30 (thirty) days., Disp: 4.5 mL, Rfl: 4   naproxen sodium (ALEVE) 220 MG tablet, Take 220 mg by mouth as needed., Disp: , Rfl:    pravastatin (PRAVACHOL) 40 MG tablet, Take 1 tablet (40 mg total) by mouth  daily., Disp: 90 tablet, Rfl: 2   SUMAtriptan (IMITREX) 100 MG tablet, Take 1 tablet (100 mg total) by mouth every 2 (two) hours as needed for migraine. As needed for Migraines, Disp: 10 tablet, Rfl: 3   Vitamin D, Ergocalciferol, (DRISDOL) 1.25 MG (50000 UNIT) CAPS capsule, TAKE 1 CAPSULE (50,000 UNITS TOTAL) BY MOUTH EVERY 7 (SEVEN) DAYS, Disp: 12 capsule, Rfl: 0   losartan-hydrochlorothiazide (HYZAAR) 50-12.5 MG tablet, Take 1 tablet by mouth daily., Disp: 90 tablet, Rfl: 1   Allergies  Allergen Reactions   Inderal [Propranolol] Rash   Tomato Rash    Review of Systems   ROS Negative unless otherwise specified per HPI.  Vitals:   Vitals:   04/08/20 1545  BP: 140/80  Pulse: 75  Temp: 98.2 F (36.8 C)  TempSrc: Temporal  SpO2: 99%  Weight: 192 lb (87.1 kg)  Height: 5\' 4"  (1.626 m)     Body mass index is 32.96 kg/m.   Physical Exam:    Physical Exam Vitals and nursing note reviewed.  Constitutional:      General: She is not in acute distress.    Appearance: She is well-developed and well-nourished. She is not ill-appearing, toxic-appearing or sickly-appearing.  HENT:     Head: Normocephalic and atraumatic.  Cardiovascular:     Rate and Rhythm: Normal rate and regular rhythm.     Heart sounds: Normal heart sounds.  Pulmonary:     Effort: Pulmonary effort is normal. No accessory muscle usage or respiratory distress.     Breath sounds: Normal breath sounds.  Skin:    General: Skin is warm, dry and intact.  Neurological:     General: No focal deficit present.     Mental Status: She is alert.     Cranial Nerves: Cranial nerves are intact.     Sensory: Sensation is intact.     Motor: Motor function is intact.     Coordination: Coordination is intact.  Psychiatric:        Mood and Affect: Mood and affect normal.        Speech: Speech normal.        Behavior: Behavior is cooperative.      Assessment and Plan:    Kerigan was seen today for  hypertension.  Diagnoses and all orders for this visit:  Mixed incontinence urge and stress Referral to pelvic floor PT. -     Ambulatory referral to Physical Therapy  Primary hypertension At goal. Continue losartan-hctz 50-12.5 mg daily. Recommend follow-up with neuro if HA remains persistent, no red flags on exam. Recommend checking BP regularly and if she develops any concerning symptoms. Follow-up in 6-12 months,  sooner if concerns. -     CBC with Differential/Platelet; Future -     Comprehensive metabolic panel; Future  Hyperlipidemia, unspecified hyperlipidemia type Update lipid panel, adjust pravachol 40 mg daily as indicated. -     Lipid panel; Future  Vitamin D deficiency Update Vit D level and will determine need for continued high dose supplementation. -     VITAMIN D 25 Hydroxy (Vit-D Deficiency, Fractures); Future  Other orders -     losartan-hydrochlorothiazide (HYZAAR) 50-12.5 MG tablet; Take 1 tablet by mouth daily.  CMA or LPN served as scribe during this visit. History, Physical, and Plan performed by medical provider. The above documentation has been reviewed and is accurate and complete.  Time spent with patient today was 25 minutes which consisted of chart review, discussing diagnosis, work up, treatment answering questions and documentation.   [name], PA-C [place] [place] 04/08/2020  Follow-up: No follow-ups on file.

## 2020-04-08 NOTE — Patient Instructions (Addendum)
It was great to see you!  Refill your blood pressure medication today.  Pelvic floor PT referral placed today.  Mammogram!!   Please make an appointment with the lab on your way out. I would like for you to return for lab work within 1-2 weeks. After midnight on the day of the lab draw, please do not eat anything. You may have water, black coffee, unsweetened tea.  Take care,  [name] PA-C

## 2020-04-09 ENCOUNTER — Other Ambulatory Visit: Payer: Self-pay | Admitting: Physician Assistant

## 2020-04-11 ENCOUNTER — Other Ambulatory Visit: Payer: Self-pay | Admitting: Physician Assistant

## 2020-04-11 MED ORDER — HYDROCHLOROTHIAZIDE 12.5 MG PO TABS: 12.5000 mg | ORAL_TABLET | Freq: Every day | ORAL | 1 refills | Status: DC

## 2020-04-15 ENCOUNTER — Other Ambulatory Visit (HOSPITAL_BASED_OUTPATIENT_CLINIC_OR_DEPARTMENT_OTHER): Payer: Self-pay | Admitting: Physician Assistant

## 2020-04-15 DIAGNOSIS — Z1231 Encounter for screening mammogram for malignant neoplasm of breast: Secondary | ICD-10-CM

## 2020-04-23 ENCOUNTER — Ambulatory Visit (HOSPITAL_BASED_OUTPATIENT_CLINIC_OR_DEPARTMENT_OTHER)
Admission: RE | Admit: 2020-04-23 | Discharge: 2020-04-23 | Disposition: A | Discharge status: Home / Self Care | Payer: BC Managed Care – PPO | Source: Ambulatory Visit | Attending: Physician Assistant | Admitting: Physician Assistant

## 2020-04-23 ENCOUNTER — Other Ambulatory Visit: Payer: Self-pay

## 2020-04-23 DIAGNOSIS — Z1231 Encounter for screening mammogram for malignant neoplasm of breast: Secondary | ICD-10-CM | POA: Diagnosis present

## 2020-04-27 ENCOUNTER — Other Ambulatory Visit: Payer: Self-pay

## 2020-04-27 ENCOUNTER — Other Ambulatory Visit (INDEPENDENT_AMBULATORY_CARE_PROVIDER_SITE_OTHER): Payer: BC Managed Care – PPO

## 2020-04-27 DIAGNOSIS — I10 Essential (primary) hypertension: Secondary | ICD-10-CM | POA: Diagnosis not present

## 2020-04-27 DIAGNOSIS — E559 Vitamin D deficiency, unspecified: Secondary | ICD-10-CM

## 2020-04-27 DIAGNOSIS — E785 Hyperlipidemia, unspecified: Secondary | ICD-10-CM

## 2020-04-27 LAB — CBC WITH DIFFERENTIAL/PLATELET
Basophils Absolute: 0 10*3/uL (ref 0.0–0.1)
Basophils Relative: 0.6 % (ref 0.0–3.0)
Eosinophils Absolute: 0.1 10*3/uL (ref 0.0–0.7)
Eosinophils Relative: 1.3 % (ref 0.0–5.0)
HCT: 42.4 % (ref 36.0–46.0)
Hemoglobin: 14.1 g/dL (ref 12.0–15.0)
Lymphocytes Relative: 27.8 % (ref 12.0–46.0)
Lymphs Abs: 1.6 10*3/uL (ref 0.7–4.0)
MCHC: 33.3 g/dL (ref 30.0–36.0)
MCV: 87.5 fl (ref 78.0–100.0)
Monocytes Absolute: 0.4 10*3/uL (ref 0.1–1.0)
Monocytes Relative: 7.6 % (ref 3.0–12.0)
Neutro Abs: 3.5 10*3/uL (ref 1.4–7.7)
Neutrophils Relative %: 62.7 % (ref 43.0–77.0)
Platelets: 244 10*3/uL (ref 150.0–400.0)
RBC: 4.84 Mil/uL (ref 3.87–5.11)
RDW: 13.9 % (ref 11.5–15.5)
WBC: 5.6 10*3/uL (ref 4.0–10.5)

## 2020-04-27 LAB — LIPID PANEL
Cholesterol: 158 mg/dL (ref 0–200)
HDL: 48.1 mg/dL (ref >39.00)
LDL Cholesterol: 87 mg/dL (ref 0–99)
NonHDL: 109.69
Total CHOL/HDL Ratio: 3
Triglycerides: 111 mg/dL (ref 0.0–149.0)
VLDL: 22.2 mg/dL (ref 0.0–40.0)

## 2020-04-27 LAB — VITAMIN D 25 HYDROXY (VIT D DEFICIENCY, FRACTURES): VITD: 32.54 ng/mL (ref 30.00–100.00)

## 2020-04-27 LAB — COMPREHENSIVE METABOLIC PANEL
ALT: 13 U/L (ref 0–35)
AST: 18 U/L (ref 0–37)
Albumin: 4.1 g/dL (ref 3.5–5.2)
Alkaline Phosphatase: 75 U/L (ref 39–117)
BUN: 15 mg/dL (ref 6–23)
CO2: 31 mEq/L (ref 19–32)
Calcium: 9.5 mg/dL (ref 8.4–10.5)
Chloride: 102 mEq/L (ref 96–112)
Creatinine, Ser: 0.66 mg/dL (ref 0.40–1.20)
GFR: 94.28 mL/min (ref >60.00)
Glucose, Bld: 104 mg/dL — ABNORMAL HIGH (ref 70–99)
Potassium: 4 mEq/L (ref 3.5–5.1)
Sodium: 141 mEq/L (ref 135–145)
Total Bilirubin: 0.8 mg/dL (ref 0.2–1.2)
Total Protein: 6.5 g/dL (ref 6.0–8.3)

## 2020-05-02 ENCOUNTER — Encounter: Payer: Self-pay | Admitting: Physician Assistant

## 2020-05-03 ENCOUNTER — Other Ambulatory Visit: Payer: Self-pay | Admitting: Physician Assistant

## 2020-05-03 DIAGNOSIS — R928 Other abnormal and inconclusive findings on diagnostic imaging of breast: Secondary | ICD-10-CM

## 2020-05-03 NOTE — Telephone Encounter (Signed)
Spoke to pt told her calling regarding message. . Told her they will contact you for further imaging. Pt verbalized understanding and said they already have. Pt asked if I had her previous mammogram? Told pt all I have is date done 05/17/2017, no actual images or report.  Pt verbalized understanding and asked where she should have old images sent. Told pt need to contact where had Mammogram done and ask them where to send. Pt verbalized understanding.

## 2020-05-11 ENCOUNTER — Encounter: Payer: Self-pay | Admitting: Physician Assistant

## 2020-05-11 NOTE — Telephone Encounter (Signed)
Patient called in and gave the information below.

## 2020-05-11 NOTE — Telephone Encounter (Signed)
Noted  

## 2020-05-11 NOTE — Telephone Encounter (Signed)
Left message on voicemail to call office. Forms completed and placed in the mail.

## 2020-05-11 NOTE — Telephone Encounter (Signed)
I have printed the form and placed on your desk.

## 2020-05-18 ENCOUNTER — Ambulatory Visit
Admission: RE | Admit: 2020-05-18 | Discharge: 2020-05-18 | Disposition: A | Discharge status: Home / Self Care | Payer: BC Managed Care – PPO | Source: Ambulatory Visit | Attending: Physician Assistant | Admitting: Physician Assistant

## 2020-05-18 ENCOUNTER — Other Ambulatory Visit: Payer: Self-pay

## 2020-05-18 ENCOUNTER — Ambulatory Visit: Payer: BC Managed Care – PPO

## 2020-05-18 DIAGNOSIS — R928 Other abnormal and inconclusive findings on diagnostic imaging of breast: Secondary | ICD-10-CM

## 2020-05-19 ENCOUNTER — Other Ambulatory Visit: Payer: BC Managed Care – PPO

## 2020-05-23 ENCOUNTER — Other Ambulatory Visit: Payer: Self-pay

## 2020-05-23 ENCOUNTER — Encounter: Payer: Self-pay | Admitting: Physician Assistant

## 2020-05-23 ENCOUNTER — Ambulatory Visit: Payer: BC Managed Care – PPO | Admitting: Physician Assistant

## 2020-05-23 VITALS — BP 121/87 | HR 111 | Temp 98.1°F | Ht 64.0 in | Wt 194.0 lb

## 2020-05-23 DIAGNOSIS — L237 Allergic contact dermatitis due to plants, except food: Secondary | ICD-10-CM

## 2020-05-23 DIAGNOSIS — M7989 Other specified soft tissue disorders: Secondary | ICD-10-CM

## 2020-05-23 MED ORDER — PREDNISONE 20 MG PO TABS: 20.0000 mg | ORAL_TABLET | Freq: Two times a day (BID) | ORAL | 0 refills | Status: AC

## 2020-05-23 MED ORDER — CEPHALEXIN 500 MG PO CAPS: 500.0000 mg | ORAL_CAPSULE | Freq: Three times a day (TID) | ORAL | 0 refills | Status: AC

## 2020-05-23 NOTE — Progress Notes (Signed)
Acute Office Visit  Subjective:    Patient ID: Alison Mahoney, female    DOB: [birth year], 60 y.o.   MRN: [id number]  Chief Complaint  Patient presents with  . Edema    Left middle finger worse    HPI Patient is in today for left middle finger pain and swelling. Pt states this started about 5 days ago after she was pruning a juniper tree. She first experienced a rash on both of her hands and arms, which showed me photos of on her phone. Now the rash has improved, but she still has some swelling in both of her hands, but mainly her L middle finger. She is able to move it. She also says this is the finger that her sister had to help pull a fine juniper needle out of for her. Denies any numbness. Says occasionally it looks more red than other times. Denies any fever or chills.    Past Medical History:  Diagnosis Date  . Ankylosing spondylitis ([place])    35 years ago  . Hyperlipidemia   . Hypertension   . Migraines     Past Surgical History:  Procedure Laterality Date  . ABDOMINAL HYSTERECTOMY  1988  . lt hand surgery  1985  . Lt middle toe surgery  1977  . OOPHORECTOMY Bilateral 08/05/2019   also removed tumors from L & R ovary; benign   . ovarian tumor removal Left 08/05/2019   2 lb, benign; @ [facility]  . Rt thumb joint replacement  1982  . SALPINGECTOMY Bilateral 08/05/2019  . vaginal delivery x3  1979, 1982, 1986    Family History  Problem Relation Age of Onset  . Arthritis Mother   . Alzheimer's disease Mother   . Arthritis Father   . Diabetes Brother   . Healthy Daughter   . Healthy Son   . Cancer Maternal Grandmother   . Cancer Paternal Grandmother   . Cancer Paternal Grandfather   . Diabetes Paternal Grandfather   . Healthy Son     Social History   Socioeconomic History  . Marital status: Single    Spouse name: Not on file  . Number of children: 3  . Years of education: Not on file  . Highest education level: Not on file  Occupational History  . Not on file   Tobacco Use  . Smoking status: Never Smoker  . Smokeless tobacco: Never Used  Vaping Use  . Vaping Use: Never used  Substance and Sexual Activity  . Alcohol use: Not Currently  . Drug use: Not Currently  . Sexual activity: Not Currently  Other Topics Concern  . Not on file  Social History Narrative   Moved from [place] in [place]; 3 children   Currently works as a Education officer, museum      Lives alone   Right handed   Caffeine: 1 cup/day, rarely may have a second cup   Social Determinants of Radio broadcast assistant Strain: Not on file  Food Insecurity: Not on file  Transportation Needs: Not on file  Physical Activity: Not on file  Stress: Not on file  Social Connections: Not on file  Intimate Partner Violence: Not on file    Outpatient Medications Prior to Visit  Medication Sig Dispense Refill  . b complex vitamins tablet Take 1 tablet by mouth daily.    Marland Kitchen BIOTIN PO Take by mouth daily.    Marland Kitchen co-enzyme Q-10 30 MG capsule Take 30 mg by  mouth daily.    . Fremanezumab-vfrm (AJOVY) 225 MG/1.5ML SOSY Inject 225 mg into the skin every 30 (thirty) days. 4.5 mL 4  . hydrochlorothiazide (HYDRODIURIL) 12.5 MG tablet Take 1 tablet (12.5 mg total) by mouth daily. 90 tablet 1  . losartan (COZAAR) 50 MG tablet TAKE 1 TABLET BY MOUTH EVERY DAY 90 tablet 1  . naproxen sodium (ALEVE) 220 MG tablet Take 220 mg by mouth as needed.    . pravastatin (PRAVACHOL) 40 MG tablet Take 1 tablet (40 mg total) by mouth daily. 90 tablet 2  . SUMAtriptan (IMITREX) 100 MG tablet Take 1 tablet (100 mg total) by mouth every 2 (two) hours as needed for migraine. As needed for Migraines 10 tablet 3  . Vitamin D, Ergocalciferol, (DRISDOL) 1.25 MG (50000 UNIT) CAPS capsule TAKE 1 CAPSULE (50,000 UNITS TOTAL) BY MOUTH EVERY 7 (SEVEN) DAYS 12 capsule 0   No facility-administered medications prior to visit.    Allergies  Allergen Reactions  . Inderal [Propranolol] Rash  . Tomato Rash     Review of Systems REFER TO HPI FOR PERTINENT POSITIVES AND NEGATIVES     Objective:    Physical Exam Vitals reviewed.  Constitutional:      Appearance: Normal appearance.  Skin:    Comments: Left middle finger diffusely swollen, slightly more than dorsum of hand and neighboring fingers. C/o pain over proximal phalanx. She has full ROM. N/V intact. Cap refill < 2 secs.   Neurological:     Mental Status: She is alert.     BP 121/87   Pulse (!) 111   Temp 98.1 F (36.7 C)   Ht 5\' 4"  (1.626 m)   Wt 194 lb (88 kg)   SpO2 98%   BMI 33.30 kg/m  Wt Readings from Last 3 Encounters:  05/23/20 194 lb (88 kg)  04/08/20 192 lb (87.1 kg)  03/28/20 194 lb (88 kg)       Assessment & Plan:   Problem List Items Addressed This Visit   None   Visit Diagnoses    Swelling of left middle finger    -  Primary   Allergic contact dermatitis due to plant           Meds ordered this encounter  Medications  . predniSONE (DELTASONE) 20 MG tablet    Sig: Take 1 tablet (20 mg total) by mouth 2 (two) times daily with a meal for 5 days.    Dispense:  10 tablet    Refill:  0  . cephALEXin (KEFLEX) 500 MG capsule    Sig: Take 1 capsule (500 mg total) by mouth 3 (three) times daily for 5 days.    Dispense:  15 capsule    Refill:  0   1. Swelling of left middle finger 2. Allergic contact dermatitis due to plant Rash (as shown on phone) appeared to be a contact dermatitis and has resolved. Edema in this finger may be a result of some lingering response to the plant, but with report of needle being stuck there, may have some cellulitis developing. Will treat with prednisone and cephalexin to take as directed. She may try warm soaks. Tylenol prn pain. Recheck if worsening or no improvement.   Daevion Navarette M Myking Sar, PA-C

## 2020-05-26 ENCOUNTER — Other Ambulatory Visit: Payer: BC Managed Care – PPO

## 2020-05-30 ENCOUNTER — Other Ambulatory Visit: Payer: Self-pay

## 2020-05-30 ENCOUNTER — Encounter: Payer: Self-pay | Admitting: Physical Therapy

## 2020-05-30 ENCOUNTER — Ambulatory Visit: Payer: BC Managed Care – PPO | Attending: Physician Assistant | Admitting: Physical Therapy

## 2020-05-30 DIAGNOSIS — M6281 Muscle weakness (generalized): Secondary | ICD-10-CM

## 2020-05-30 DIAGNOSIS — R279 Unspecified lack of coordination: Secondary | ICD-10-CM | POA: Diagnosis present

## 2020-05-30 NOTE — Patient Instructions (Addendum)

## 2020-05-31 NOTE — Therapy (Signed)
[facility] [address] [place], [place], [address] Phone: [phone]   Fax:  [phone]  Physical Therapy Evaluation  Patient Details  Name: Alison Mahoney MRN: [id number] Date of Birth: 1960-08-07 Referring Provider (PT): [name], [place]   Encounter Date: 05/30/2020   PT End of Session - 05/30/20 1653    Visit Number 1    Date for PT Re-Evaluation 08/22/20    Authorization Type BCBS    PT Start Time 1624    PT Stop Time 1714    PT Time Calculation (min) 50 min    Activity Tolerance Patient tolerated treatment well    Behavior During Therapy [facility] for tasks assessed/performed           Past Medical History:  Diagnosis Date  . Ankylosing spondylitis ([place])    35 years ago  . Hyperlipidemia   . Hypertension   . Migraines     Past Surgical History:  Procedure Laterality Date  . ABDOMINAL HYSTERECTOMY  1988  . lt hand surgery  1985  . Lt middle toe surgery  1977  . OOPHORECTOMY Bilateral 08/05/2019   also removed tumors from L & R ovary; benign   . ovarian tumor removal Left 08/05/2019   2 lb, benign; @ [facility]  . Rt thumb joint replacement  1982  . SALPINGECTOMY Bilateral 08/05/2019  . vaginal delivery x3  1979, 1982, 1986    There were no vitals filed for this visit.    Subjective Assessment - 05/30/20 1629    Subjective I am having a significant amount of loss of urine.  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  It happens about 3-4x/month sporadically.  I am more likely to go with any twinge. I thought that it was from sitting on the floor at one point, but now it doesn't seem to be connected to anything.  I sit a lot for work.  I am doing more working in the yard.    Patient Stated Goals stop leaking    Currently in Pain? No/denies              [facility] PT Assessment - 05/31/20 0001      Assessment   Medical Diagnosis N39.46 (ICD-10-CM) - Mixed incontinence urge and stress     Referring Provider (PT) [name], PA    Onset Date/Surgical Date --   2 years   Prior Therapy no      Precautions   Precautions None      Restrictions   Weight Bearing Restrictions No      Balance Screen   Has the patient fallen in the past 6 months No      [place] residence      Prior Function   Level of Independence Independent    Vocation Full time employment    Vocation Requirements --   office work     Cognition   Overall Cognitive Status Within Functional Limits for tasks assessed      Posture/Postural Control   Posture/Postural Control No significant limitations      ROM / Strength   AROM / PROM / Strength PROM      PROM   Overall PROM Comments [facility]      Flexibility   Soft Tissue Assessment /Muscle Length yes    Hamstrings Rt 90%      Ambulation/Gait   Gait Pattern Within Functional Limits  Objective measurements completed on examination: See above findings.     Pelvic Floor Special Questions - 05/31/20 0001    Are you Pregnant or attempting pregnancy? No    Prior Pregnancies Yes    Number of Pregnancies 3    Number of Vaginal Deliveries 3    Any difficulty with labor and deliveries No    Currently Sexually Active No    Urinary Leakage Yes    How often 3-4/month full inconinence of bladder    Pad use every once in a while if will be at the office    Urinary urgency No    Urinary frequency every 2-3 hours; nocturia 1-2x night    Fluid intake 1-2 16oz    Caffeine beverages 16oz    Falling out feeling (prolapse) No    Skin Integrity Other    Skin Integrity other labio minora pale skin coloring    Prolapse Anterior Wall;Posterior Wall    Pelvic Floor Internal Exam pt identity confirmed and internal soft tissue assessment performed with consent    Exam Type Vaginal    Sensation diminished    Palpation high tone    Strength fair squeeze, definite lift    Strength # of  reps --   4 quick   Strength # of seconds 4    Tone high            [facility] Adult PT Treatment/Exercise - 05/31/20 0001      Self-Care   Self-Care Other Self-Care Comments    Other Self-Care Comments  moisturizing                  PT Education - 05/31/20 1246    Education Details moisturizing and stretch to vaginal canal    Person(s) Educated Patient    Methods Explanation;Demonstration;Tactile cues;Verbal cues;Handout    Comprehension Returned demonstration;Verbalized understanding            PT Short Term Goals - 05/31/20 1012      PT SHORT TERM GOAL #1   Title Pt to be independent with initial HEP    Time 2    Period Weeks    Status New    Target Date 06/13/20             PT Long Term Goals - 05/31/20 1013      PT LONG TERM GOAL #1   Title Pt will report 75% less leakage    Baseline full bladder    Time 12    Period Weeks    Status New    Target Date 08/22/20      PT LONG TERM GOAL #2   Title Pt will have at minimum 8 second stream when she voids every time    Baseline maybe 4 seconds feels normal    Time 12    Period Weeks    Status New    Target Date 08/22/20      PT LONG TERM GOAL #3   Title Pt will be ind with advanced HEP to maintain strength and pelvic health    Time 12    Period Weeks    Status New    Target Date 08/22/20      PT LONG TERM GOAL #4   Title Pt will be able to demonstrate at least 12 seconds of endurance of pelvic floor contraction    Time 12    Period Weeks    Status New    Target Date 08/22/20  Plan - 05/30/20 1656    Clinical Impression Statement Pt presents to clinic today due to full bladder incontinence that she has had for a couple of years.  Since it began she had tumors removed from ovaries that were found to be benign.  Pt arrived late so limited assessment to LE was performed and will do more full assessment at follow up.  She does have tension palpated throughout the incision  from abdominal surgery .  Pt has pelvic floor tension and a hard time relaxing after performing kegels.  Pt was able to contract and hold for 4 seconds 3/5 MMT, and did 4 quick flicks but they gradually got weaker.  Prolapse was noted anterior and posterior but patient without symptoms.  Pt does have vaginal dryness.  Pt has tension in Rt h/s.  Pt will benefit from skilled PT to address impairments above and achieve functional goals.    Personal Factors and Comorbidities Comorbidity 2;Time since onset of injury/illness/exacerbation    Comorbidities 3 vaginal deliveries; abdominal hysterectomy,    Examination-Activity Limitations Continence;Toileting    Examination-Participation Restrictions Community Activity;Occupation    Stability/Clinical Decision Making Stable/Uncomplicated    Clinical Decision Making Low    Rehab Potential Good    PT Frequency 1x / week    PT Duration 12 weeks    PT Treatment/Interventions ADLs/Self Care Home Management;Biofeedback;Cryotherapy;Electrical Stimulation;Moist Heat;Therapeutic activities;Therapeutic exercise;Neuromuscular re-education;Patient/family education;Manual techniques;Passive range of motion;Taping;Dry needling    PT Next Visit Plan stretch and relax with breathing; maybe easy kegel with long rest between, fascial release to abdomen briefly    Consulted and Agree with Plan of Care Patient           Patient will benefit from skilled therapeutic intervention in order to improve the following deficits and impairments:  Pain,Increased fascial restricitons,Decreased strength,Decreased coordination  Visit Diagnosis: Muscle weakness (generalized)  Unspecified lack of coordination     Problem List Patient Active Problem List   Diagnosis Date Noted  . Vitamin D deficiency 04/08/2020  . Mixed incontinence urge and stress 04/08/2020  . 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 Hyperlipidemia 08/07/2019  . HTN (hypertension) 04/13/2016  . Migraine 04/13/2016     Jule Ser, PT 05/31/2020, 12:47 PM  [facility] [address], [place], [place], [address] Phone: [phone]   Fax:  [phone]  Name: CHERYL CHAY MRN: [id number] Date of Birth: [birth year]

## 2020-05-31 NOTE — Addendum Note (Signed)
Addended by: Su Hoff on: 05/31/2020 12:49 PM   Modules accepted: Orders

## 2020-06-18 ENCOUNTER — Other Ambulatory Visit: Payer: Self-pay | Admitting: Physician Assistant

## 2020-07-09 ENCOUNTER — Other Ambulatory Visit: Payer: Self-pay

## 2020-07-09 ENCOUNTER — Emergency Department (HOSPITAL_BASED_OUTPATIENT_CLINIC_OR_DEPARTMENT_OTHER)
Admission: EM | Admit: 2020-07-09 | Discharge: 2020-07-09 | Disposition: A | Discharge status: Home / Self Care | Payer: BC Managed Care – PPO | Attending: Emergency Medicine | Admitting: Emergency Medicine

## 2020-07-09 ENCOUNTER — Encounter (HOSPITAL_BASED_OUTPATIENT_CLINIC_OR_DEPARTMENT_OTHER): Payer: Self-pay | Admitting: *Deleted

## 2020-07-09 DIAGNOSIS — I10 Essential (primary) hypertension: Secondary | ICD-10-CM | POA: Diagnosis not present

## 2020-07-09 DIAGNOSIS — Z79899 Other long term (current) drug therapy: Secondary | ICD-10-CM | POA: Insufficient documentation

## 2020-07-09 DIAGNOSIS — W268XXA Contact with other sharp object(s), not elsewhere classified, initial encounter: Secondary | ICD-10-CM | POA: Diagnosis not present

## 2020-07-09 DIAGNOSIS — S61012A Laceration without foreign body of left thumb without damage to nail, initial encounter: Secondary | ICD-10-CM | POA: Insufficient documentation

## 2020-07-09 DIAGNOSIS — S60932A Unspecified superficial injury of left thumb, initial encounter: Secondary | ICD-10-CM | POA: Diagnosis present

## 2020-07-09 MED ORDER — LIDOCAINE-EPINEPHRINE (PF) 2 %-1:200000 IJ SOLN
10.0000 mL | Freq: Once | INTRAMUSCULAR | Status: AC
  Administered 2020-07-09: 10 mL
  Filled 2020-07-09: qty 20

## 2020-07-09 NOTE — ED Triage Notes (Addendum)
Pt reports she cut her left hand while putting together a metal garden bed. Lac present to webbing of left hand between thumb and forefinger

## 2020-07-09 NOTE — Discharge Instructions (Addendum)
It was our pleasure to provide your ER care today - we hope that you feel better.  Keep area very clean. Avoid gripping objects/tools with hand until after sutures removed, as it may cause sutures to tear through.   Have sutures removed, your doctor or urgent care, in 10-12 days.   Return to ER if worse, new symptoms, infection of wound, or other concern.

## 2020-07-09 NOTE — ED Provider Notes (Signed)
[facility] Provider Note   CSN: [id number] Arrival date & time: 07/09/20  2040     History Chief Complaint  Patient presents with  . Laceration    Alison Mahoney is a 60 y.o. female.  Patient c/o accidental cut to base of left thumb this evening while putting together metal bed. States nothing broke off in wound, was just sharp edge of metal. 1 cm laceration to base of thumb. Minor bleeding stopped with pressure. Denies other pain or injury. Tetanus is up to date. No associated numbness. Able to move thumb normally.   The history is provided by the patient.  Laceration Associated symptoms: no fever        Past Medical History:  Diagnosis Date  . Ankylosing spondylitis ([place])    35 years ago  . Hyperlipidemia   . Hypertension   . Migraines     Patient Active Problem List   Diagnosis Date Noted  . Vitamin D deficiency 04/08/2020  . Mixed incontinence urge and stress 04/08/2020  . 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 Hyperlipidemia 08/07/2019  . HTN (hypertension) 04/13/2016  . Migraine 04/13/2016    Past Surgical History:  Procedure Laterality Date  . ABDOMINAL HYSTERECTOMY  1988  . lt hand surgery  1985  . Lt middle toe surgery  1977  . OOPHORECTOMY Bilateral 08/05/2019   also removed tumors from L & R ovary; benign   . ovarian tumor removal Left 08/05/2019   2 lb, benign; @ [facility]  . Rt thumb joint replacement  1982  . SALPINGECTOMY Bilateral 08/05/2019  . vaginal delivery x3  1979, 1982, 1986     OB History   No obstetric history on file.     Family History  Problem Relation Age of Onset  . Arthritis Mother   . Alzheimer's disease Mother   . Arthritis Father   . Diabetes Brother   . Healthy Daughter   . Healthy Son   . Cancer Maternal Grandmother   . Cancer Paternal Grandmother   . Cancer Paternal Grandfather   . Diabetes Paternal Grandfather   . Healthy Son     Social History   Tobacco Use  . Smoking status: Never  Smoker  . Smokeless tobacco: Never Used  Vaping Use  . Vaping Use: Never used  Substance Use Topics  . Alcohol use: Not Currently  . Drug use: Not Currently    Home Medications Prior to Admission medications   Medication Sig Start Date End Date Taking? Authorizing Provider  b complex vitamins tablet Take 1 tablet by mouth daily.    [provider]  BIOTIN PO Take by mouth daily.    [provider]  co-enzyme Q-10 30 MG capsule Take 30 mg by mouth daily.    [provider]  Fremanezumab-vfrm (AJOVY) 225 MG/1.5ML SOSY Inject 225 mg into the skin every 30 (thirty) days. 11/23/19   Melvenia Beam, MD  hydrochlorothiazide (HYDRODIURIL) 12.5 MG tablet Take 1 tablet (12.5 mg total) by mouth daily. 04/11/20   [name], PA  losartan (COZAAR) 50 MG tablet TAKE 1 TABLET BY MOUTH EVERY DAY 04/11/20   [name], PA  naproxen sodium (ALEVE) 220 MG tablet Take 220 mg by mouth as needed.    [provider]  pravastatin (PRAVACHOL) 40 MG tablet Take 1 tablet (40 mg total) by mouth daily. 12/17/19   [name], PA  SUMAtriptan (IMITREX) 100 MG tablet TAKE 1 TABLET EVERY 2 (TWO) HOURS AS NEEDED  FOR MIGRAINE. AS NEEDED FOR MIGRAINES 06/22/20   [name], PA  Vitamin D, Ergocalciferol, (DRISDOL) 1.25 MG (50000 UNIT) CAPS capsule TAKE 1 CAPSULE (50,000 UNITS TOTAL) BY MOUTH EVERY 7 (SEVEN) DAYS 03/08/20   [name], PA    Allergies    Inderal [propranolol] and Tomato  Review of Systems   Review of Systems  Constitutional: Negative for fever.  Musculoskeletal:       Left hand/thumb injury.   Skin: Negative for wound.  Neurological: Negative for numbness.    Physical Exam Updated Vital Signs BP (!) 124/98 (BP Location: Right Arm)   Pulse 98   Temp 98.6 F (37 C) (Oral)   Resp 16   Ht 1.626 m (5\' 4" )   Wt 83.9 kg   SpO2 100%   BMI 31.76 kg/m   Physical Exam Vitals and nursing note reviewed.  Constitutional:       Appearance: Normal appearance. She is well-developed.  HENT:     Head: Atraumatic.     Nose: Nose normal.     Mouth/Throat:     Mouth: Mucous membranes are moist.  Eyes:     General: No scleral icterus.    Conjunctiva/sclera: Conjunctivae normal.  Neck:     Trachea: No tracheal deviation.  Cardiovascular:     Rate and Rhythm: Normal rate.     Pulses: Normal pulses.  Pulmonary:     Effort: Pulmonary effort is normal. No respiratory distress.  Genitourinary:    Comments: No cva tenderness.  Musculoskeletal:        General: No swelling.     Cervical back: Neck supple. No muscular tenderness.     Comments: 1 cm lac to base of thumb, web-space. No fb seen or felt. No bone exposed. Normal cap refill distally in thumb. Normal rom thumb.   Skin:    General: Skin is warm and dry.     Findings: No rash.  Neurological:     Mental Status: She is alert.     Comments: Alert, speech normal. Left thumb NVI.   Psychiatric:        Mood and Affect: Mood normal.     ED Results / Procedures / Treatments   Labs (all labs ordered are listed, but only abnormal results are displayed) Labs Reviewed - No data to display  EKG None  Radiology No results found.  Procedures .Marland KitchenLaceration Repair  Date/Time: 07/09/2020 10:25 PM Performed by: Lajean Saver, MD Authorized by: Lajean Saver, MD   Consent:    Consent given by:  Patient Anesthesia:    Anesthesia method:  Local infiltration   Local anesthetic:  Lidocaine 2% w/o epi Laceration details:    Location:  Finger   Finger location:  L thumb   Length (cm):  1 Pre-procedure details:    Preparation:  Patient was prepped and draped in usual sterile fashion Exploration:    Wound exploration: wound explored through full range of motion and entire depth of wound visualized     Contaminated: no   Treatment:    Area cleansed with:  Saline   Irrigation solution:  Sterile saline   Irrigation method:  Syringe Skin repair:    Repair method:   Sutures   Suture size:  4-0   Suture material:  Prolene   Suture technique:  Simple interrupted   Number of sutures:  2 Repair type:    Repair type:  Simple Post-procedure details:    Dressing:  Antibiotic ointment and sterile dressing  Procedure completion:  Tolerated well, no immediate complications     Medications Ordered in ED Medications  lidocaine-EPINEPHrine (XYLOCAINE W/EPI) 2 %-1:200000 (PF) injection 10 mL (has no administration in time range)    ED Course  I have reviewed the triage vital signs and the nursing notes.  Pertinent labs & imaging results that were available during my care of the patient were reviewed by me and considered in my medical decision making (see chart for details).    MDM Rules/Calculators/A&P                         Wound repair.   Reviewed nursing notes and prior charts for additional history.   Bacitracin and sterile dressing. Tetanus is up to date.   Return precautions provided.     Final Clinical Impression(s) / ED Diagnoses Final diagnoses:  None    Rx / DC Orders ED Discharge Orders    None       Lajean Saver, MD 07/09/20 2227

## 2020-07-20 ENCOUNTER — Ambulatory Visit: Payer: BC Managed Care – PPO | Attending: Physician Assistant | Admitting: Physical Therapy

## 2020-07-20 ENCOUNTER — Other Ambulatory Visit: Payer: Self-pay

## 2020-07-20 DIAGNOSIS — R279 Unspecified lack of coordination: Secondary | ICD-10-CM | POA: Insufficient documentation

## 2020-07-20 DIAGNOSIS — M6281 Muscle weakness (generalized): Secondary | ICD-10-CM | POA: Diagnosis not present

## 2020-07-20 NOTE — Therapy (Signed)
[facility] [address] [place], [place], [address] Phone: [phone]   Fax:  [phone]  Physical Therapy Treatment  Patient Details  Name: Alison Mahoney MRN: [id number] Date of Birth: 1961-01-05 Referring Provider (PT): [name], [place]   Encounter Date: 07/20/2020   PT End of Session - 07/20/20 1620    Visit Number 2    Date for PT Re-Evaluation 08/22/20    Authorization Type BCBS    PT Start Time 7544    PT Stop Time 1658    PT Time Calculation (min) 41 min    Activity Tolerance Patient tolerated treatment well    Behavior During Therapy [facility] for tasks assessed/performed           Past Medical History:  Diagnosis Date  . Ankylosing spondylitis ([place])    35 years ago  . Hyperlipidemia   . Hypertension   . Migraines     Past Surgical History:  Procedure Laterality Date  . ABDOMINAL HYSTERECTOMY  1988  . lt hand surgery  1985  . Lt middle toe surgery  1977  . OOPHORECTOMY Bilateral 08/05/2019   also removed tumors from L & R ovary; benign   . ovarian tumor removal Left 08/05/2019   2 lb, benign; @ [facility]  . Rt thumb joint replacement  1982  . SALPINGECTOMY Bilateral 08/05/2019  . vaginal delivery x3  1979, 1982, 1986    There were no vitals filed for this visit.   Subjective Assessment - 07/20/20 1621    Subjective I think my stream is definitely 8 seconds.  I have had a couple full incontinence    Patient Stated Goals stop leaking    Currently in Pain? No/denies                             [facility] Adult PT Treatment/Exercise - 07/20/20 0001      Self-Care   Other Self-Care Comments  moisturize review and urge drills      Neuro Re-ed    Neuro Re-ed Details  diaphragmatic breathing      Exercises   Exercises Lumbar      Lumbar Exercises: Stretches   Active Hamstring Stretch 30 seconds    Double Knee to Chest Stretch 30 seconds    Piriformis Stretch 30 seconds       Manual Therapy   Manual Therapy Myofascial release;Soft tissue mobilization    Soft tissue mobilization adductors    Myofascial Release abdominal central around and above bladder                  PT Education - 07/20/20 1700    Education Details Access Code: [email or web address] and urge drill    Person(s) Educated Patient    Methods Explanation;Demonstration;Tactile cues;Verbal cues;Handout    Comprehension Verbalized understanding;Returned demonstration            PT Short Term Goals - 05/31/20 1012      PT SHORT TERM GOAL #1   Title Pt to be independent with initial HEP    Time 2    Period Weeks    Status New    Target Date 06/13/20             PT Long Term Goals - 05/31/20 1013      PT LONG TERM GOAL #1   Title Pt will report 75% less leakage    Baseline full bladder  Time 12    Period Weeks    Status New    Target Date 08/22/20      PT LONG TERM GOAL #2   Title Pt will have at minimum 8 second stream when she voids every time    Baseline maybe 4 seconds feels normal    Time 12    Period Weeks    Status New    Target Date 08/22/20      PT LONG TERM GOAL #3   Title Pt will be ind with advanced HEP to maintain strength and pelvic health    Time 12    Period Weeks    Status New    Target Date 08/22/20      PT LONG TERM GOAL #4   Title Pt will be able to demonstrate at least 12 seconds of endurance of pelvic floor contraction    Time 12    Period Weeks    Status New    Target Date 08/22/20                 Plan - 07/20/20 1737    Clinical Impression Statement Pt continues to report along the lines of mostly urge incontinence that is induced by environmental stimuli as well as fullness of bladder.  Today's session focused on breathing and stretching in order to address high tone pelvic floor.  stretch and relax with moisturizer was reviewed.  Pt had good fascial release in abdomen today as well in order to release any tension for scar tissue around  the bladder. Pt recommended to continue with skilled PT to address impairments    PT Treatment/Interventions ADLs/Self Care Home Management;Biofeedback;Cryotherapy;Electrical Stimulation;Moist Heat;Therapeutic activities;Therapeutic exercise;Neuromuscular re-education;Patient/family education;Manual techniques;Passive range of motion;Taping;Dry needling    PT Next Visit Plan f/u on urge drills and stretches, work on endurance and core strength    PT Home Exercise Plan Access Code: [email or web address]    Consulted and Agree with Plan of Care Patient           Patient will benefit from skilled therapeutic intervention in order to improve the following deficits and impairments:  Pain,Increased fascial restricitons,Decreased strength,Decreased coordination  Visit Diagnosis: Muscle weakness (generalized)  Unspecified lack of coordination     Problem List Patient Active Problem List   Diagnosis Date Noted  . Vitamin D deficiency 04/08/2020  . Mixed incontinence urge and stress 04/08/2020  . 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 Hyperlipidemia 08/07/2019  . HTN (hypertension) 04/13/2016  . Migraine 04/13/2016    Jule Ser, PT 07/20/2020, 5:46 PM  [facility] [address] [place], [place], [address] Phone: [phone]   Fax:  [phone]  Name: Alison Mahoney MRN: [id number] Date of Birth: Oct 17, 1960

## 2020-07-20 NOTE — Patient Instructions (Signed)
  Access Code: [email or web address] URL: [email or web address] Date: 07/20/2020 Prepared by: Jari Favre  Exercises Supine Butterfly Groin Stretch - 1 x daily - 7 x weekly - 3 reps - 1 sets - 30 sec hold Supine Piriformis Stretch - 1 x daily - 7 x weekly - 3 reps - 1 sets - 30 sec hold Supine Pelvic Floor Stretch - 1 x daily - 7 x weekly - 3 reps - 1 sets - 30 sec hold Supine Hamstring Stretch - 1 x daily - 7 x weekly - 3 reps - 1 sets - 30 sec hold Supine Diaphragmatic Breathing - 3 x daily - 7 x weekly - 10 reps - 1 sets

## 2020-07-25 ENCOUNTER — Encounter: Payer: BC Managed Care – PPO | Admitting: Physical Therapy

## 2020-07-27 ENCOUNTER — Encounter: Payer: Self-pay | Admitting: Physical Therapy

## 2020-07-27 ENCOUNTER — Other Ambulatory Visit: Payer: Self-pay

## 2020-07-27 ENCOUNTER — Ambulatory Visit: Payer: BC Managed Care – PPO | Admitting: Physical Therapy

## 2020-07-27 DIAGNOSIS — M6281 Muscle weakness (generalized): Secondary | ICD-10-CM | POA: Diagnosis not present

## 2020-07-27 DIAGNOSIS — R279 Unspecified lack of coordination: Secondary | ICD-10-CM

## 2020-07-27 NOTE — Therapy (Signed)
[facility] [address] [place], [place], [address] Phone: [phone]   Fax:  [phone]  Physical Therapy Treatment  Patient Details  Name: CHERLYNN POPIEL MRN: [id number] Date of Birth: 05/10/1960 Referring Provider (PT): [name], [place]   Encounter Date: 07/27/2020   PT End of Session - 07/27/20 1631     Visit Number 3    Date for PT Re-Evaluation 08/22/20    Authorization Type BCBS    PT Start Time 1619    PT Stop Time 1654    PT Time Calculation (min) 35 min    Activity Tolerance Patient tolerated treatment well    Behavior During Therapy [facility] for tasks assessed/performed             Past Medical History:  Diagnosis Date   Ankylosing spondylitis ([place])    35 years ago   Hyperlipidemia    Hypertension    Migraines     Past Surgical History:  Procedure Laterality Date   ABDOMINAL HYSTERECTOMY  1988   lt hand surgery  1985   Lt middle toe surgery  1977   OOPHORECTOMY Bilateral 08/05/2019   also removed tumors from L & R ovary; benign    ovarian tumor removal Left 08/05/2019   2 lb, benign; @ [facility]   Rt thumb joint replacement  1982   SALPINGECTOMY Bilateral 08/05/2019   vaginal delivery x3  1979, 1982, 1986    There were no vitals filed for this visit.   Subjective Assessment - 07/27/20 1622     Subjective Pt states she has had some success.  Pt had a little leakage and stopped it.    Patient Stated Goals stop leaking    Currently in Pain? No/denies                               [facility] Adult PT Treatment/Exercise - 07/27/20 0001       Lumbar Exercises: Stretches   Active Hamstring Stretch 30 seconds    Piriformis Stretch 30 seconds      Lumbar Exercises: Standing   Functional Squats 10 reps      Lumbar Exercises: Seated   Other Seated Lumbar Exercises ball squeeze; clam; hip IR                    PT Education - 07/27/20 1649     Education  Details Access Code: [email or web address]    Person(s) Educated Patient    Methods Explanation;Demonstration;Tactile cues;Handout;Verbal cues    Comprehension Verbalized understanding;Returned demonstration              PT Short Term Goals - 07/27/20 1642       PT SHORT TERM GOAL #1   Title Pt to be independent with initial HEP    Status Achieved               PT Long Term Goals - 07/27/20 1712       PT LONG TERM GOAL #1   Title Pt will report 75% less leakage    Status On-going      PT LONG TERM GOAL #2   Title Pt will have at minimum 8 second stream when she voids every time    Status On-going      PT LONG TERM GOAL #3   Title Pt will be ind with advanced HEP to maintain strength and pelvic health  Status On-going                   Plan - 07/27/20 1659     Clinical Impression Statement Pt made excellent progress without full incontinence this week. Pt has had a lot of success with the urge techniques.  Pt was able to do kegels in sitting . She was given cues and educated on correct timing of kegels and taking adequate rest breaks and stretches between sets.  Pt will benefit from skilled PT to progress core and pelvic floor strength    PT Treatment/Interventions ADLs/Self Care Home Management;Biofeedback;Cryotherapy;Electrical Stimulation;Moist Heat;Therapeutic activities;Therapeutic exercise;Neuromuscular re-education;Patient/family education;Manual techniques;Passive range of motion;Taping;Dry needling    PT Next Visit Plan f/u on exercises added to HEP today, core and endurance    PT Home Exercise Plan Access Code: [email or web address]    Consulted and Agree with Plan of Care Patient             Patient will benefit from skilled therapeutic intervention in order to improve the following deficits and impairments:  Pain, Increased fascial restricitons, Decreased strength, Decreased coordination  Visit Diagnosis: Muscle weakness (generalized)  Unspecified lack of  coordination     Problem List Patient Active Problem List   Diagnosis Date Noted   Vitamin D deficiency 04/08/2020   Mixed incontinence urge and stress 04/08/2020   Adnexal mass 08/07/2019   Hyperlipidemia 08/07/2019   HTN (hypertension) 04/13/2016   Migraine 04/13/2016    Camillo Flaming Reggie Bise, PT 07/27/2020, 5:13 PM  [facility] [address], [place], [place], [address] Phone: [phone]   Fax:  [phone]  Name: PRISCILLA KIRSTEIN MRN: [id number] Date of Birth: [birth year]

## 2020-07-27 NOTE — Patient Instructions (Signed)
Access Code: [email or web address] URL: [email or web address] Date: 07/27/2020 Prepared by: Jari Favre  Exercises Supine Butterfly Groin Stretch - 1 x daily - 7 x weekly - 3 reps - 1 sets - 30 sec hold Supine Piriformis Stretch - 1 x daily - 7 x weekly - 3 reps - 1 sets - 30 sec hold Supine Pelvic Floor Stretch - 1 x daily - 7 x weekly - 3 reps - 1 sets - 30 sec hold Supine Hamstring Stretch - 1 x daily - 7 x weekly - 3 reps - 1 sets - 30 sec hold Supine Diaphragmatic Breathing - 3 x daily - 7 x weekly - 10 reps - 1 sets Seated Pelvic Floor Contraction with Hip Abduction and Resistance Loop - 3 x daily - 7 x weekly - 10 reps - 1 sets - 3 sec hold Seated Pelvic Floor Contraction with Isometric Hip Adduction - 3 x daily - 7 x weekly - 10 reps - 1 sets - 3 sechold, 3 sec rest hold Seated Figure 4 Piriformis Stretch - 1 x daily - 7 x weekly - 3 reps - 1 sets - 30 hold Mini Squat with Counter Support - 1 x daily - 7 x weekly - 1 sets - 10 reps Seated Hip Internal Rotation with Resistance - 1 x daily - 7 x weekly - 1 sets - 10 reps Seated Hamstring Stretch - 1 x daily - 7 x weekly - 3 reps - 1 sets - 30 sec hold

## 2020-08-04 ENCOUNTER — Encounter: Payer: BC Managed Care – PPO | Admitting: Physical Therapy

## 2020-08-08 ENCOUNTER — Encounter: Payer: BC Managed Care – PPO | Admitting: Physical Therapy

## 2020-08-15 ENCOUNTER — Other Ambulatory Visit: Payer: Self-pay | Admitting: Physician Assistant

## 2020-08-17 ENCOUNTER — Encounter: Payer: Self-pay | Admitting: Physical Therapy

## 2020-08-17 ENCOUNTER — Ambulatory Visit: Payer: BC Managed Care – PPO | Attending: Physician Assistant | Admitting: Physical Therapy

## 2020-08-17 ENCOUNTER — Other Ambulatory Visit: Payer: Self-pay

## 2020-08-17 DIAGNOSIS — R279 Unspecified lack of coordination: Secondary | ICD-10-CM | POA: Diagnosis present

## 2020-08-17 DIAGNOSIS — M6281 Muscle weakness (generalized): Secondary | ICD-10-CM | POA: Insufficient documentation

## 2020-08-17 NOTE — Therapy (Signed)
[facility] [address] [place], [place], [address] Phone: [phone]   Fax:  [phone]  Physical Therapy Treatment  Patient Details  Name: Alison Mahoney MRN: [id number] Date of Birth: 16-Sep-1960 Referring Provider (PT): [name], [place]   Encounter Date: 08/17/2020   PT End of Session - 08/17/20 1619     Visit Number 4    Date for PT Re-Evaluation 10/12/20    Authorization Type BCBS    PT Start Time 1617    PT Stop Time 4580    PT Time Calculation (min) 38 min    Activity Tolerance Patient tolerated treatment well    Behavior During Therapy [facility] for tasks assessed/performed             Past Medical History:  Diagnosis Date   Ankylosing spondylitis ([place])    35 years ago   Hyperlipidemia    Hypertension    Migraines     Past Surgical History:  Procedure Laterality Date   ABDOMINAL HYSTERECTOMY  1988   lt hand surgery  1985   Lt middle toe surgery  1977   OOPHORECTOMY Bilateral 08/05/2019   also removed tumors from L & R ovary; benign    ovarian tumor removal Left 08/05/2019   2 lb, benign; @ [facility]   Rt thumb joint replacement  1982   SALPINGECTOMY Bilateral 08/05/2019   vaginal delivery x3  1979, 1982, 1986    There were no vitals filed for this visit.   Subjective Assessment - 08/17/20 1624     Subjective Pt states she was not as good this time as the last time with doing the exercises.    Patient Stated Goals stop leaking    Currently in Pain? No/denies                [facility] PT Assessment - 08/17/20 0001       Assessment   Medical Diagnosis N39.46 (ICD-10-CM) - Mixed incontinence urge and stress    Referring Provider (PT) [name], [place]                           [facility] Adult PT Treatment/Exercise - 08/17/20 0001       Neuro Re-ed    Neuro Re-ed Details  exhale on exertion      Lumbar Exercises: Standing   Functional Squats 10 reps   mini staggered  squat   Other Standing Lumbar Exercises staggered stance kegel both sides and then lean fwd - exhale with kegel    Other Standing Lumbar Exercises lift 2 lb with exhale      Lumbar Exercises: Supine   Other Supine Lumbar Exercises kegel holding 6 sec; no breath holding and cues needed                    PT Education - 08/17/20 1712     Education Details Access Code: [email or web address]    Person(s) Educated Patient    Methods Explanation;Demonstration;Tactile cues;Verbal cues;Handout    Comprehension Verbalized understanding;Returned demonstration              PT Short Term Goals - 07/27/20 1642       PT SHORT TERM GOAL #1   Title Pt to be independent with initial HEP    Status Achieved               PT Long Term Goals - 08/17/20 1630  PT LONG TERM GOAL #1   Title Pt will report 75% less leakage    Baseline only having small amount right when going to sit on the toilet - happens 30% of the time - 60-70% better    Time 8    Period Weeks    Status Partially Met    Target Date 10/12/20      PT LONG TERM GOAL #2   Title Pt will have at minimum 8 second stream when she voids every time    Baseline met 8 seconds every time    Time 8    Status Achieved    Target Date 10/12/20      PT LONG TERM GOAL #3   Title Pt will be ind with advanced HEP to maintain strength and pelvic health    Time 8    Period Weeks    Status On-going    Target Date 10/12/20      PT LONG TERM GOAL #4   Title Pt will be able to demonstrate at least 12 seconds of endurance of pelvic floor contraction    Baseline 6 sec    Time 8    Period Weeks    Status On-going    Target Date 10/12/20                   Plan - 08/17/20 1706     Clinical Impression Statement Pt has made excellent progress towards her goals. She continues to need a lot of cues to work on breathing and muscle coordination. Pt has difficulty when sitting to use the bathroom and still has some leakage at  those times.  Pt's goals were updated and she is recommended to continue with skilled PT to address remaining functional strength deficits and be ind with HEP to maintain gains in PT.    PT Frequency 1x / week    PT Duration 8 weeks    PT Treatment/Interventions ADLs/Self Care Home Management;Biofeedback;Cryotherapy;Electrical Stimulation;Moist Heat;Therapeutic activities;Therapeutic exercise;Neuromuscular re-education;Patient/family education;Manual techniques;Passive range of motion;Taping;Dry needling    PT Next Visit Plan continue with standing execise progression and endurance, f/u on exhale with exertion techniques    PT Home Exercise Plan Access Code: [email or web address]    Consulted and Agree with Plan of Care Patient             Patient will benefit from skilled therapeutic intervention in order to improve the following deficits and impairments:  Pain, Increased fascial restricitons, Decreased strength, Decreased coordination  Visit Diagnosis: Muscle weakness (generalized)  Unspecified lack of coordination     Problem List Patient Active Problem List   Diagnosis Date Noted   Vitamin D deficiency 04/08/2020   Mixed incontinence urge and stress 04/08/2020   Adnexal mass 08/07/2019   Hyperlipidemia 08/07/2019   HTN (hypertension) 04/13/2016   Migraine 04/13/2016    Jule Ser, PT 08/17/2020, 5:21 PM  [facility] [address], [place], [place], [address] Phone: [phone]   Fax:  [phone]  Name: Alison Mahoney MRN: [id number] Date of Birth: [birth year]

## 2020-08-17 NOTE — Patient Instructions (Signed)
Access Code: [email or web address] URL: [email or web address] Date: 08/17/2020 Prepared by: Jari Favre  Exercises Supine Butterfly Groin Stretch - 1 x daily - 7 x weekly - 3 reps - 1 sets - 30 sec hold Supine Piriformis Stretch - 1 x daily - 7 x weekly - 3 reps - 1 sets - 30 sec hold Supine Pelvic Floor Stretch - 1 x daily - 7 x weekly - 3 reps - 1 sets - 30 sec hold Supine Hamstring Stretch - 1 x daily - 7 x weekly - 3 reps - 1 sets - 30 sec hold Supine Diaphragmatic Breathing - 3 x daily - 7 x weekly - 10 reps - 1 sets Seated Pelvic Floor Contraction with Hip Abduction and Resistance Loop - 3 x daily - 7 x weekly - 10 reps - 1 sets - 3 sec hold Seated Pelvic Floor Contraction with Isometric Hip Adduction - 3 x daily - 7 x weekly - 10 reps - 1 sets - 3 sechold, 3 sec rest hold Seated Figure 4 Piriformis Stretch - 1 x daily - 7 x weekly - 3 reps - 1 sets - 30 hold Mini Squat with Counter Support - 1 x daily - 7 x weekly - 1 sets - 10 reps Seated Hip Internal Rotation with Resistance - 1 x daily - 7 x weekly - 1 sets - 10 reps Seated Hamstring Stretch - 1 x daily - 7 x weekly - 3 reps - 1 sets - 30 sec hold Staggered Stance Biceps Curl - 1 x daily - 7 x weekly - 2 sets - 10 reps

## 2020-08-24 ENCOUNTER — Encounter: Payer: BC Managed Care – PPO | Admitting: Physical Therapy

## 2020-08-31 ENCOUNTER — Other Ambulatory Visit: Payer: Self-pay

## 2020-08-31 ENCOUNTER — Ambulatory Visit: Payer: BC Managed Care – PPO | Admitting: Physical Therapy

## 2020-08-31 ENCOUNTER — Encounter: Payer: Self-pay | Admitting: Physical Therapy

## 2020-08-31 DIAGNOSIS — R279 Unspecified lack of coordination: Secondary | ICD-10-CM

## 2020-08-31 DIAGNOSIS — M6281 Muscle weakness (generalized): Secondary | ICD-10-CM | POA: Diagnosis not present

## 2020-08-31 NOTE — Therapy (Signed)
[facility] [address] [place], [place], [address] Phone: [phone]   Fax:  [phone]  Physical Therapy Treatment  Patient Details  Name: Alison Mahoney MRN: [id number] Date of Birth: 1961-01-29 Referring Provider (PT): [name], [place]   Encounter Date: 08/31/2020   PT End of Session - 08/31/20 1626     Visit Number 5    Date for PT Re-Evaluation 10/12/20    Authorization Type BCBS    PT Start Time 1626   late   PT Stop Time 1656    PT Time Calculation (min) 30 min    Activity Tolerance Patient tolerated treatment well    Behavior During Therapy [facility] for tasks assessed/performed             Past Medical History:  Diagnosis Date   Ankylosing spondylitis ([place])    35 years ago   Hyperlipidemia    Hypertension    Migraines     Past Surgical History:  Procedure Laterality Date   ABDOMINAL HYSTERECTOMY  1988   lt hand surgery  1985   Lt middle toe surgery  1977   OOPHORECTOMY Bilateral 08/05/2019   also removed tumors from L & R ovary; benign    ovarian tumor removal Left 08/05/2019   2 lb, benign; @ [facility]   Rt thumb joint replacement  1982   SALPINGECTOMY Bilateral 08/05/2019   vaginal delivery x3  1979, 1982, 1986    There were no vitals filed for this visit.   Subjective Assessment - 08/31/20 1633     Subjective I had one time when I just leaked out of nowhere.  Other than that, I feel stronger with bending and lifting and less anxious about leaking    Patient Stated Goals stop leaking    Currently in Pain? No/denies                               [facility] Adult PT Treatment/Exercise - 08/31/20 0001       Lumbar Exercises: Aerobic   UBE (Upper Arm Bike) L1 x 6 min fwd/back      Lumbar Exercises: Standing   Functional Squats 10 reps   anti rotation   Other Standing Lumbar Exercises staggered stance kegel both sides and then lean fwd - exhale with kegel; half kneel  anti rotation    Other Standing Lumbar Exercises lift 3 lb with exhale on foam mat                    PT Education - 08/31/20 1714     Education Details Access Code: [email or web address]    Person(s) Educated Patient    Methods Explanation;Demonstration;Tactile cues;Handout    Comprehension Verbalized understanding;Returned demonstration              PT Short Term Goals - 07/27/20 1642       PT SHORT TERM GOAL #1   Title Pt to be independent with initial HEP    Status Achieved               PT Long Term Goals - 08/31/20 1713       PT LONG TERM GOAL #1   Title Pt will report 75% less leakage    Baseline only one time last two weeks    Status Partially Met  Plan - 08/31/20 1645     Clinical Impression Statement Pt continues to make progress with exercises today.  Pt is able to do more challanging exericses.  She did have one incident of leakage but was sitting in an odd position and maybe wasn't feeling normal sensations. Pt did much better with exhale on exertion.  Pt will benefit from skilled PT to continue to work on maintaining pelvic floor contraction with breathing and increasing endurance.    PT Treatment/Interventions ADLs/Self Care Home Management;Biofeedback;Cryotherapy;Electrical Stimulation;Moist Heat;Therapeutic activities;Therapeutic exercise;Neuromuscular re-education;Patient/family education;Manual techniques;Passive range of motion;Taping;Dry needling    PT Home Exercise Plan Access Code: [email or web address]    Consulted and Agree with Plan of Care Patient             Patient will benefit from skilled therapeutic intervention in order to improve the following deficits and impairments:  Pain, Increased fascial restricitons, Decreased strength, Decreased coordination  Visit Diagnosis: Muscle weakness (generalized)  Unspecified lack of coordination     Problem List Patient Active Problem List   Diagnosis Date Noted   Vitamin  D deficiency 04/08/2020   Mixed incontinence urge and stress 04/08/2020   Adnexal mass 08/07/2019   Hyperlipidemia 08/07/2019   HTN (hypertension) 04/13/2016   Migraine 04/13/2016    Jakki L Desenglau, PT 08/31/2020, 5:16 PM  [facility] [address] [place], [place], [address] Phone: [phone]   Fax:  [phone]  Name: Alison Mahoney MRN: [id number] Date of Birth: 11/01/1960    

## 2020-08-31 NOTE — Patient Instructions (Signed)
Access Code: [email or web address] URL: [email or web address] Date: 08/31/2020 Prepared by: Jari Favre  Exercises Supine Butterfly Groin Stretch - 1 x daily - 7 x weekly - 3 reps - 1 sets - 30 sec hold Supine Piriformis Stretch - 1 x daily - 7 x weekly - 3 reps - 1 sets - 30 sec hold Supine Pelvic Floor Stretch - 1 x daily - 7 x weekly - 3 reps - 1 sets - 30 sec hold Supine Hamstring Stretch - 1 x daily - 7 x weekly - 3 reps - 1 sets - 30 sec hold Supine Diaphragmatic Breathing - 3 x daily - 7 x weekly - 10 reps - 1 sets Seated Pelvic Floor Contraction with Hip Abduction and Resistance Loop - 3 x daily - 7 x weekly - 10 reps - 1 sets - 3 sec hold Seated Pelvic Floor Contraction with Isometric Hip Adduction - 3 x daily - 7 x weekly - 10 reps - 1 sets - 3 sechold, 3 sec rest hold Seated Figure 4 Piriformis Stretch - 1 x daily - 7 x weekly - 3 reps - 1 sets - 30 hold Mini Squat with Counter Support - 1 x daily - 7 x weekly - 1 sets - 10 reps Seated Hip Internal Rotation with Resistance - 1 x daily - 7 x weekly - 1 sets - 10 reps Seated Hamstring Stretch - 1 x daily - 7 x weekly - 3 reps - 1 sets - 30 sec hold Staggered Stance Biceps Curl - 1 x daily - 7 x weekly - 2 sets - 10 reps Half Kneeling Anti-Rotation Press - Forward Leg Opposite Anchor Side - 1 x daily - 7 x weekly - 3 sets - 10 reps Squatting Anti-Rotation Press - 1 x daily - 7 x weekly - 3 sets - 10 reps

## 2020-09-07 ENCOUNTER — Ambulatory Visit: Payer: BC Managed Care – PPO | Admitting: Physical Therapy

## 2020-09-17 ENCOUNTER — Other Ambulatory Visit: Payer: Self-pay | Admitting: Physician Assistant

## 2020-10-10 ENCOUNTER — Other Ambulatory Visit: Payer: Self-pay | Admitting: Physician Assistant

## 2020-11-09 ENCOUNTER — Encounter: Payer: Self-pay | Admitting: Physician Assistant

## 2020-11-09 ENCOUNTER — Ambulatory Visit (INDEPENDENT_AMBULATORY_CARE_PROVIDER_SITE_OTHER): Payer: BC Managed Care – PPO | Admitting: Physician Assistant

## 2020-11-09 ENCOUNTER — Other Ambulatory Visit: Payer: Self-pay

## 2020-11-09 VITALS — BP 130/84 | HR 88 | Temp 97.1°F | Ht 65.0 in | Wt 189.0 lb

## 2020-11-09 DIAGNOSIS — H539 Unspecified visual disturbance: Secondary | ICD-10-CM

## 2020-11-09 DIAGNOSIS — E785 Hyperlipidemia, unspecified: Secondary | ICD-10-CM | POA: Diagnosis not present

## 2020-11-09 DIAGNOSIS — Z0001 Encounter for general adult medical examination with abnormal findings: Secondary | ICD-10-CM

## 2020-11-09 DIAGNOSIS — I10 Essential (primary) hypertension: Secondary | ICD-10-CM

## 2020-11-09 DIAGNOSIS — E669 Obesity, unspecified: Secondary | ICD-10-CM

## 2020-11-09 LAB — COMPREHENSIVE METABOLIC PANEL
ALT: 12 U/L (ref 0–35)
AST: 22 U/L (ref 0–37)
Albumin: 4.6 g/dL (ref 3.5–5.2)
Alkaline Phosphatase: 78 U/L (ref 39–117)
BUN: 12 mg/dL (ref 6–23)
CO2: 26 mEq/L (ref 19–32)
Calcium: 9.6 mg/dL (ref 8.4–10.5)
Chloride: 102 mEq/L (ref 96–112)
Creatinine, Ser: 0.63 mg/dL (ref 0.40–1.20)
GFR: 94.98 mL/min (ref >60.00)
Glucose, Bld: 93 mg/dL (ref 70–99)
Potassium: 3.4 mEq/L — ABNORMAL LOW (ref 3.5–5.1)
Sodium: 138 mEq/L (ref 135–145)
Total Bilirubin: 0.8 mg/dL (ref 0.2–1.2)
Total Protein: 7.4 g/dL (ref 6.0–8.3)

## 2020-11-09 LAB — LIPID PANEL
Cholesterol: 194 mg/dL (ref 0–200)
HDL: 54.2 mg/dL (ref >39.00)
LDL Cholesterol: 116 mg/dL — ABNORMAL HIGH (ref 0–99)
NonHDL: 140.03
Total CHOL/HDL Ratio: 4
Triglycerides: 120 mg/dL (ref 0.0–149.0)
VLDL: 24 mg/dL (ref 0.0–40.0)

## 2020-11-09 LAB — CBC WITH DIFFERENTIAL/PLATELET
Basophils Absolute: 0 10*3/uL (ref 0.0–0.1)
Basophils Relative: 0.6 % (ref 0.0–3.0)
Eosinophils Absolute: 0.1 10*3/uL (ref 0.0–0.7)
Eosinophils Relative: 1.1 % (ref 0.0–5.0)
HCT: 46.3 % — ABNORMAL HIGH (ref 36.0–46.0)
Hemoglobin: 15.2 g/dL — ABNORMAL HIGH (ref 12.0–15.0)
Lymphocytes Relative: 21.6 % (ref 12.0–46.0)
Lymphs Abs: 1.2 10*3/uL (ref 0.7–4.0)
MCHC: 32.8 g/dL (ref 30.0–36.0)
MCV: 89.7 fl (ref 78.0–100.0)
Monocytes Absolute: 0.4 10*3/uL (ref 0.1–1.0)
Monocytes Relative: 7.1 % (ref 3.0–12.0)
Neutro Abs: 3.9 10*3/uL (ref 1.4–7.7)
Neutrophils Relative %: 69.6 % (ref 43.0–77.0)
Platelets: 216 10*3/uL (ref 150.0–400.0)
RBC: 5.17 Mil/uL — ABNORMAL HIGH (ref 3.87–5.11)
RDW: 13.7 % (ref 11.5–15.5)
WBC: 5.6 10*3/uL (ref 4.0–10.5)

## 2020-11-09 NOTE — Progress Notes (Signed)
I acted as a Education administrator for [employer], PA-C [employer], LPN    Subjective:    Alison Mahoney is a 60 y.o. female and is here for a comprehensive physical exam.   HPI  Health Maintenance Due  Topic Date Due   DEXA SCAN  Never done    Acute Concerns: Vision changes -- needs updated script and new doctor. Denies vision loss or pain.  Chronic Issues: HTN Currently taking losartan 50 mg and hctz 12.5 mg. At home blood pressure readings are: not checked. Patient denies chest pain, SOB, blurred vision, dizziness, unusual headaches, lower leg swelling. Patient is compliant with medication. Denies excessive caffeine intake, stimulant usage, excessive alcohol intake, or increase in salt consumption.  BP Readings from Last 3 Encounters:  11/09/20 130/84  07/09/20 134/82  05/23/20 121/87   HLD Taking pravastatin 40 mg daily. Denies myalgias.  Health Maintenance: Immunizations -- UTD Cologuard-- UTD due 07/2022 Mammogram -- UTD. Due 04/2021 PAP -- N/A Hysterectomy Bone Density -- will do at 44 Diet -- feels like she eats well balanced meals Sleep habits -- mostly sleeping well Exercise -- limited, does not enjoy exercising Weight -- Weight: 189 lb (85.7 kg)  Mood -- overall good Weight history: Wt Readings from Last 10 Encounters:  11/09/20 189 lb (85.7 kg)  07/09/20 185 lb (83.9 kg)  05/23/20 194 lb (88 kg)  04/08/20 192 lb (87.1 kg)  03/28/20 194 lb (88 kg)  12/16/19 188 lb (85.3 kg)  11/23/19 186 lb (84.4 kg)  09/15/19 181 lb 9.6 oz (82.4 kg)  08/28/19 180 lb (81.6 kg)  08/10/19 189 lb 8 oz (86 kg)   Body mass index is 31.45 kg/m. No LMP recorded. Patient has had a hysterectomy. Alcohol use:  reports that she does not currently use alcohol. Tobacco use:  Tobacco Use: Low Risk    Smoking Tobacco Use: Never   Smokeless Tobacco Use: Never     Depression screen PHQ 2/9 11/09/2020  Decreased Interest 0  Down, Depressed, Hopeless 0  PHQ - 2 Score 0      Other providers/specialists: Patient Care Team: [name], [place] as PCP - General (Physician Assistant) Lyndee Hensen, PT as Physical Therapist (Physical Therapy)    PMHx, SurgHx, SocialHx, Medications, and Allergies were reviewed in the Visit Navigator and updated as appropriate.   Past Medical History:  Diagnosis Date   Ankylosing spondylitis ([place])    35 years ago   Hyperlipidemia    Hypertension    Migraines      Past Surgical History:  Procedure Laterality Date   ABDOMINAL HYSTERECTOMY  1988   lt hand surgery  1985   Lt middle toe surgery  1977   OOPHORECTOMY Bilateral 08/05/2019   also removed tumors from L & R ovary; benign    ovarian tumor removal Left 08/05/2019   2 lb, benign; @ [facility]   Rt thumb joint replacement  1982   SALPINGECTOMY Bilateral 08/05/2019   vaginal delivery x3  1979, 1982, 1986     Family History  Problem Relation Age of Onset   Arthritis Mother    Alzheimer's disease Mother    Arthritis Father    Diabetes Brother    Healthy Daughter    Healthy Son    Cancer Maternal Grandmother    Cancer Paternal Grandmother    Cancer Paternal Grandfather    Diabetes Paternal Grandfather    Healthy Son     Social History   Tobacco Use  Smoking status: Never   Smokeless tobacco: Never  Vaping Use   Vaping Use: Never used  Substance Use Topics   Alcohol use: Not Currently   Drug use: Not Currently    Review of Systems:   Review of Systems  Constitutional:  Negative for chills, fever, malaise/fatigue and weight loss.  HENT:  Negative for hearing loss, sinus pain and sore throat.   Respiratory:  Negative for cough and hemoptysis.   Cardiovascular:  Negative for chest pain, palpitations, leg swelling and PND.  Gastrointestinal:  Negative for abdominal pain, constipation, diarrhea, heartburn, nausea and vomiting.  Genitourinary:  Negative for dysuria, frequency and urgency.  Musculoskeletal:  Negative for back pain, myalgias and  neck pain.  Skin:  Negative for itching and rash.  Neurological:  Negative for dizziness, tingling, seizures and headaches.  Endo/Heme/Allergies:  Negative for polydipsia.  Psychiatric/Behavioral:  Negative for depression. The patient is not nervous/anxious.    Objective:   BP 130/84 (BP Location: Left Arm, Patient Position: Sitting, Cuff Size: Large)   Pulse 88   Temp (!) 97.1 F (36.2 C) (Temporal)   Ht 5\' 5"  (1.651 m)   Wt 189 lb (85.7 kg)   SpO2 99%   BMI 31.45 kg/m  Body mass index is 31.45 kg/m.   General Appearance:    Alert, cooperative, no distress, appears stated age  Head:    Normocephalic, without obvious abnormality, atraumatic  Eyes:    PERRL, conjunctiva/corneas clear, EOM's intact, fundi    benign, both eyes  Ears:    Normal TM's and external ear canals, both ears  Nose:   Nares normal, septum midline, mucosa normal, no drainage    or sinus tenderness  Throat:   Lips, mucosa, and tongue normal; teeth and gums normal  Neck:   Supple, symmetrical, trachea midline, no adenopathy;    thyroid:  no enlargement/tenderness/nodules; no carotid   bruit or JVD  Back:     Symmetric, no curvature, ROM normal, no CVA tenderness  Lungs:     Clear to auscultation bilaterally, respirations unlabored  Chest Wall:    No tenderness or deformity   Heart:    Regular rate and rhythm, S1 and S2 normal, no murmur, rub or gallop  Breast Exam:    No tenderness, masses, or nipple abnormality  Abdomen:     Soft, non-tender, bowel sounds active all four quadrants,    no masses, no organomegaly  Genitalia:    Deferred  Extremities:   Extremities normal, atraumatic, no cyanosis or edema  Pulses:   2+ and symmetric all extremities  Skin:   Skin color, texture, turgor normal, no rashes or lesions  Lymph nodes:   Cervical, supraclavicular, and axillary nodes normal  Neurologic:   CNII-XII intact, normal strength, sensation and reflexes    throughout    Assessment/Plan:   Encounter for  general adult medical examination with abnormal findings Today patient counseled on age appropriate routine health concerns for screening and prevention, each reviewed and up to date or declined. Immunizations reviewed and up to date or declined. Labs ordered and reviewed. Risk factors for depression reviewed and negative. Hearing function and visual acuity are intact. ADLs screened and addressed as needed. Functional ability and level of safety reviewed and appropriate. Education, counseling and referrals performed based on assessed risks today. Patient provided with a copy of personalized plan for preventive services.  Vision changes Referral to [facility]  Hyperlipidemia, unspecified hyperlipidemia type Update lipid panel and provide recommendations to pravastatin 40  mg daily  Essential hypertension Normotensive Continue losartan 50 mg daily and hctz 12.5 mg daily  Obesity, unspecified classification, unspecified obesity type, unspecified whether serious comorbidity present Continue to work on diet and exercise    Patient Counseling: [x]    Nutrition: Stressed importance of moderation in sodium/caffeine intake, saturated fat and cholesterol, caloric balance, sufficient intake of fresh fruits, vegetables, fiber, calcium, iron, and 1 mg of folate supplement per day (for females capable of pregnancy).  [x]    Stressed the importance of regular exercise.   [x]    Substance Abuse: Discussed cessation/primary prevention of tobacco, alcohol, or other drug use; driving or other dangerous activities under the influence; availability of treatment for abuse.   [x]    Injury prevention: Discussed safety belts, safety helmets, smoke detector, smoking near bedding or upholstery.   [x]    Sexuality: Discussed sexually transmitted diseases, partner selection, use of condoms, avoidance of unintended pregnancy  and contraceptive alternatives.  [x]    Dental health: Discussed importance of regular tooth brushing,  flossing, and dental visits.  [x]    Health maintenance and immunizations reviewed. Please refer to Health maintenance section.   CMA or LPN served as scribe during this visit. History, Physical, and Plan performed by medical provider. The above documentation has been reviewed and is accurate and complete.   [name], PA-C [place]

## 2020-11-09 NOTE — Patient Instructions (Addendum)
It was great to see you!  Dr. Katy Fitch -- eye exam -- they will call you for an appointment  Dr. Sherren Mocha. Bing Plume -- dentist --> [phone]  Figure out what blood pressure medications you are currently taking  Update your flu and COVID booster  Please go to the lab for blood work.   Our office will call you with your results unless you have chosen to receive results via MyChart.  If your blood work is normal we will follow-up each year for physicals and as scheduled for chronic medical problems.  If anything is abnormal we will treat accordingly and get you in for a follow-up.  Take care,  Aldona Bar

## 2020-11-10 ENCOUNTER — Other Ambulatory Visit: Payer: Self-pay | Admitting: Physician Assistant

## 2020-11-10 ENCOUNTER — Other Ambulatory Visit: Payer: Self-pay | Admitting: Family Medicine

## 2020-11-10 MED ORDER — PRAVASTATIN SODIUM 80 MG PO TABS: 80.0000 mg | ORAL_TABLET | Freq: Every day | ORAL | 1 refills | Status: DC

## 2020-12-06 ENCOUNTER — Other Ambulatory Visit: Payer: Self-pay | Admitting: Neurology

## 2020-12-06 DIAGNOSIS — G43709 Chronic migraine without aura, not intractable, without status migrainosus: Secondary | ICD-10-CM

## 2020-12-07 ENCOUNTER — Other Ambulatory Visit: Payer: Self-pay | Admitting: *Deleted

## 2020-12-12 ENCOUNTER — Other Ambulatory Visit: Payer: Self-pay | Admitting: Physician Assistant

## 2021-01-07 ENCOUNTER — Other Ambulatory Visit: Payer: Self-pay | Admitting: Physician Assistant

## 2021-01-09 ENCOUNTER — Telehealth: Payer: Self-pay

## 2021-01-09 MED ORDER — LOSARTAN POTASSIUM-HCTZ 50-12.5 MG PO TABS: 1.0000 | ORAL_TABLET | Freq: Every day | ORAL | 1 refills | Status: DC

## 2021-01-09 NOTE — Telephone Encounter (Signed)
Pt notified Rx was sent to pharmacy as requested. 

## 2021-01-09 NOTE — Telephone Encounter (Signed)
MEDICATION: losartan-hydrochlorothiazide (HYZAAR) 50-12.5 MG tablet [Pharmacy Med Name: LOSARTAN-HCTZ 50-12.5 MG TAB]  PHARMACY:  [id number] - [place], [place] - [address]. Phone:  [phone]  Fax:  [phone]      Comments: Patient would like it to be 90 days since it will be cheaper   **Let patient know to contact pharmacy at the end of the day to make sure medication is ready. **  ** Please notify patient to allow 48-72 hours to process**  **Encourage patient to contact the pharmacy for refills or they can request refills through [facility]**

## 2021-01-24 ENCOUNTER — Telehealth: Payer: Self-pay

## 2021-01-24 NOTE — Telephone Encounter (Signed)
Patient is scheduled.  Nurse Assessment Nurse: Self, RN, Nira Conn Date/Time (Eastern Time): 01/24/2021 9:23:19 AM Confirm and document reason for call. If symptomatic, describe symptoms. ---Caller has been having rectal bleeding off and on x 2 wks.  Does the patient have any new or worsening symptoms? ---Yes Will a triage be completed? ---Yes Related visit to physician within the last 2 weeks? ---No Does the PT have any chronic conditions? (i.e. diabetes, asthma, this includes High risk factors for pregnancy, etc.) ---No Is this a behavioral health or substance abuse call? ---No  Rectal Bleeding MILD rectal bleeding (more than just a few drops or streaks) Self, RN, Heather 01/24/2021 9:25:40 AM  01/24/2021 9:28:52 AM SEE PCP WITHIN 3 DAYS Yes Self, RN, Nira Conn

## 2021-01-24 NOTE — Telephone Encounter (Signed)
FYI, pt scheduled tomorrow.

## 2021-01-25 ENCOUNTER — Other Ambulatory Visit: Payer: Self-pay

## 2021-01-25 ENCOUNTER — Encounter: Payer: Self-pay | Admitting: Physician Assistant

## 2021-01-25 ENCOUNTER — Ambulatory Visit: Payer: BC Managed Care – PPO | Admitting: Physician Assistant

## 2021-01-25 VITALS — BP 130/90 | HR 77 | Temp 97.6°F | Ht 65.0 in | Wt 191.4 lb

## 2021-01-25 DIAGNOSIS — K625 Hemorrhage of anus and rectum: Secondary | ICD-10-CM | POA: Diagnosis not present

## 2021-01-25 MED ORDER — HYDROCORTISONE ACETATE 25 MG RE SUPP: 25.0000 mg | Freq: Two times a day (BID) | RECTAL | 0 refills | Status: DC

## 2021-01-25 MED ORDER — DILTIAZEM GEL 2 %: CUTANEOUS | 1 refills | Status: DC

## 2021-01-25 NOTE — Progress Notes (Signed)
Alison Mahoney is a 60 y.o. female here for rectal bleeding.  History of Present Illness:   Chief Complaint  Patient presents with   Rectal Bleeding    Pt c/o rectal bleeding off and on past few weeks.    Rectal Bleeding Alison Mahoney presents with c/o intermittent rectal bleeding that has been onset for two weeks. According to pt, in the past she has seen spots of blood following a BM, but this occurred infrequently and never presented further issues. It wasn't until these episodes of rectal bleeding became more frequent and intense. States following a BM, she has had to apply pressure to her anus until the bleeding stops. This was the last straw for Alison Mahoney and she knew she needed to have this evaluated.   Upon further discussion, Alison Mahoney did recall that years ago she had experienced a terrible episode of constipation due to using Inderal. Following this episode, she believes that she had an anal tear that occasionally reopens. Additionally she believes it could be a hemorrhoid since she does sit all day due to her job. Denies lightheadedness or dizziness, bouts of constipation, abdominal pain, melena, vaginal bleeding, or unintentional weight changes.   Past Medical History:  Diagnosis Date   Ankylosing spondylitis ([place])    35 years ago   Hyperlipidemia    Hypertension    Migraines      Social History   Tobacco Use   Smoking status: Never   Smokeless tobacco: Never  Vaping Use   Vaping Use: Never used  Substance Use Topics   Alcohol use: Not Currently   Drug use: Not Currently    Past Surgical History:  Procedure Laterality Date   ABDOMINAL HYSTERECTOMY  1988   lt hand surgery  1985   Lt middle toe surgery  1977   OOPHORECTOMY Bilateral 08/05/2019   also removed tumors from L & R ovary; benign    ovarian tumor removal Left 08/05/2019   2 lb, benign; @ [facility]   Rt thumb joint replacement  1982   SALPINGECTOMY Bilateral 08/05/2019   vaginal delivery x3  1979, 1982, 1986    Family  History  Problem Relation Age of Onset   Arthritis Mother    Alzheimer's disease Mother    Arthritis Father    Diabetes Brother    Healthy Daughter    Healthy Son    Cancer Maternal Grandmother    Cancer Paternal Grandmother    Cancer Paternal Grandfather    Diabetes Paternal Grandfather    Healthy Son     Allergies  Allergen Reactions   Inderal [Propranolol] Rash   Tomato Rash    Current Medications:   Current Outpatient Medications:    AJOVY 225 MG/1.5ML SOSY, INJECT 225 MG INTO THE SKIN EVERY 30 (THIRTY) DAYS., Disp: 1.5 mL, Rfl: 5   b complex vitamins tablet, Take 1 tablet by mouth daily., Disp: , Rfl:    BIOTIN PO, Take by mouth daily., Disp: , Rfl:    losartan-hydrochlorothiazide (HYZAAR) 50-12.5 MG tablet, Take 1 tablet by mouth daily., Disp: 90 tablet, Rfl: 1   naproxen sodium (ALEVE) 220 MG tablet, Take 220 mg by mouth as needed., Disp: , Rfl:    pravastatin (PRAVACHOL) 80 MG tablet, Take 1 tablet (80 mg total) by mouth daily., Disp: 90 tablet, Rfl: 1   SUMAtriptan (IMITREX) 100 MG tablet, TAKE 1 TABLET EVERY 2 (TWO) HOURS AS NEEDED FOR MIGRAINE. AS NEEDED FOR MIGRAINES, Disp: 9 tablet, Rfl: 4   Vitamin D,  Ergocalciferol, (DRISDOL) 1.25 MG (50000 UNIT) CAPS capsule, TAKE 1 CAPSULE (50,000 UNITS TOTAL) BY MOUTH EVERY 7 (SEVEN) DAYS, Disp: 12 capsule, Rfl: 0   co-enzyme Q-10 30 MG capsule, Take 30 mg by mouth daily. (Patient not taking: Reported on 11/09/2020), Disp: , Rfl:    Review of Systems:   ROS Negative unless otherwise specified per HPI. Vitals:   Vitals:   01/25/21 1559  BP: 130/90  Pulse: 77  Temp: 97.6 F (36.4 C)  TempSrc: Temporal  SpO2: 98%  Weight: 191 lb 6.1 oz (86.8 kg)  Height: 5\' 5"  (1.651 m)     Body mass index is 31.85 kg/m.  Physical Exam:   Physical Exam Vitals and nursing note reviewed.  Constitutional:      General: She is not in acute distress.    Appearance: She is well-developed. She is not ill-appearing or toxic-appearing.   Cardiovascular:     Rate and Rhythm: Normal rate and regular rhythm.     Pulses: Normal pulses.     Heart sounds: Normal heart sounds, S1 normal and S2 normal.  Pulmonary:     Effort: Pulmonary effort is normal.     Breath sounds: Normal breath sounds.  Genitourinary:    Rectum: Anal fissure and external hemorrhoid present.  Skin:    General: Skin is warm and dry.  Neurological:     Mental Status: She is alert.     GCS: GCS eye subscore is 4. GCS verbal subscore is 5. GCS motor subscore is 6.  Psychiatric:        Speech: Speech normal.        Behavior: Behavior normal. Behavior is cooperative.    Assessment and Plan:   Rectal Bleeding No red flags on exam Trial anusol suppository for hemorrhoid Trial 2% diltiazem gel for anal fissure Place referral to gastroenterology  Avoid constipation and prolonged sitting Update CBC and provide recommendations/adjust urgency if needed  I,Havlyn C Ratchford,acting as a scribe for [employer], PA.,have documented all relevant documentation on the behalf of Alison Coke, PA,as directed by  Alison Coke, PA while in the presence of Alison Mahoney, [place].  I, Alison Mahoney, [place], have reviewed all documentation for this visit. The documentation on 01/25/21 for the exam, diagnosis, procedures, and orders are all accurate and complete.  Alison Coke, PA-C

## 2021-01-25 NOTE — Patient Instructions (Signed)
It was great to see you!  I think you have an anal fissure and a hemorrhoid.  I am putting in a referral to gastroenterology.  We are faxing script to [facility] for the diltiazem ointment (for the fissure/tear) and I have also sent in anusol (steroid) suppositories for you to use in your rectum to help calm down your hemorrhoid  If any worsening bleeding, let me know  Take care,  [name] PA-C

## 2021-01-26 LAB — CBC WITH DIFFERENTIAL/PLATELET
Basophils Absolute: 0 10*3/uL (ref 0.0–0.1)
Basophils Relative: 0.8 % (ref 0.0–3.0)
Eosinophils Absolute: 0.1 10*3/uL (ref 0.0–0.7)
Eosinophils Relative: 1.1 % (ref 0.0–5.0)
HCT: 42.3 % (ref 36.0–46.0)
Hemoglobin: 14.2 g/dL (ref 12.0–15.0)
Lymphocytes Relative: 30.4 % (ref 12.0–46.0)
Lymphs Abs: 1.8 10*3/uL (ref 0.7–4.0)
MCHC: 33.4 g/dL (ref 30.0–36.0)
MCV: 88.8 fl (ref 78.0–100.0)
Monocytes Absolute: 0.4 10*3/uL (ref 0.1–1.0)
Monocytes Relative: 6.9 % (ref 3.0–12.0)
Neutro Abs: 3.5 10*3/uL (ref 1.4–7.7)
Neutrophils Relative %: 60.8 % (ref 43.0–77.0)
Platelets: 216 10*3/uL (ref 150.0–400.0)
RBC: 4.77 Mil/uL (ref 3.87–5.11)
RDW: 13.2 % (ref 11.5–15.5)
WBC: 5.8 10*3/uL (ref 4.0–10.5)

## 2021-02-08 ENCOUNTER — Other Ambulatory Visit: Payer: Self-pay | Admitting: Physician Assistant

## 2021-02-16 DIAGNOSIS — Z85828 Personal history of other malignant neoplasm of skin: Secondary | ICD-10-CM | POA: Insufficient documentation

## 2021-02-16 DIAGNOSIS — L719 Rosacea, unspecified: Secondary | ICD-10-CM | POA: Insufficient documentation

## 2021-02-16 DIAGNOSIS — D485 Neoplasm of uncertain behavior of skin: Secondary | ICD-10-CM | POA: Insufficient documentation

## 2021-02-23 ENCOUNTER — Ambulatory Visit: Payer: BC Managed Care – PPO | Admitting: Nurse Practitioner

## 2021-02-24 ENCOUNTER — Encounter: Payer: Self-pay | Admitting: Nurse Practitioner

## 2021-02-24 ENCOUNTER — Ambulatory Visit: Payer: BC Managed Care – PPO | Admitting: Nurse Practitioner

## 2021-02-24 ENCOUNTER — Encounter: Payer: Self-pay | Admitting: Gastroenterology

## 2021-02-24 VITALS — BP 138/84 | HR 68 | Ht 64.0 in | Wt 189.1 lb

## 2021-02-24 DIAGNOSIS — K602 Anal fissure, unspecified: Secondary | ICD-10-CM | POA: Diagnosis not present

## 2021-02-24 DIAGNOSIS — K625 Hemorrhage of anus and rectum: Secondary | ICD-10-CM

## 2021-02-24 NOTE — Progress Notes (Signed)
02/24/2021 Alison Mahoney [id number] 03-07-1960   CHIEF COMPLAINT: Rectal bleeding  HISTORY OF PRESENT ILLNESS: Alison Mahoney is a 61 year old female with a past medical history of hypertension, hyperlipidemia, migraine headaches and ankylosing spondylitis. She presents to our office today as referred by [name] PA-C for further evaluation regarding rectal bleeding.  She complains of having bright red rectal bleeding for the past 6 to 8 weeks with associated anal soreness/stinging pain.  She describes seeing bright red blood on the toilet tissue and possibly on the stool at times.  Blood does not drip into the toilet water.  She reports having a history of an anal fissure dating back 20 years ago.  She was recently seen by her PCP who prescribed a diltiazem 2% fissure cream for suspected anal fissure.  She applied the fissure cream twice daily to the external anal area for less than 1 week then stopped it as she did not see any improvement.  No unusual discharge from the anal rectum.  No fevers.  She reported undergoing a colonoscopy in 2004 2005 in [place], 1 or 2 polyps possibly were removed, further details are unclear.  She reported undergoing a Cologuard test 07/2019 which was negative.  No GERD symptoms.  She takes Aleve 1 or 2 tabs 3 times monthly for headaches.  No known family history of colorectal cancer.  No other complaints at this time.  CBC Latest Ref Rng & Units 01/25/2021 11/09/2020 04/27/2020  WBC 4.0 - 10.5 K/uL 5.8 5.6 5.6  Hemoglobin 12.0 - 15.0 g/dL 14.2 15.2(H) 14.1  Hematocrit 36.0 - 46.0 % 42.3 46.3(H) 42.4  Platelets 150.0 - 400.0 K/uL 216.0 216.0 244.0    CMP Latest Ref Rng & Units 11/09/2020 04/27/2020 12/16/2019  Glucose 70 - 99 mg/dL 93 104(H) 115(H)  BUN 6 - 23 mg/dL 12 15 11   Creatinine 0.40 - 1.20 mg/dL 0.63 0.66 0.73  Sodium 135 - 145 mEq/L 138 141 142  Potassium 3.5 - 5.1 mEq/L 3.4(L) 4.0 4.3  Chloride 96 - 112 mEq/L 102 102 104  CO2 19 -  32 mEq/L 26 31 33(H)  Calcium 8.4 - 10.5 mg/dL 9.6 9.5 9.8  Total Protein 6.0 - 8.3 g/dL 7.4 6.5 6.7  Total Bilirubin 0.2 - 1.2 mg/dL 0.8 0.8 0.6  Alkaline Phos 39 - 117 U/L 78 75 -  AST 0 - 37 U/L 22 18 16   ALT 0 - 35 U/L 12 13 11      Past Medical History:  Diagnosis Date   Ankylosing spondylitis ([place])    35 years ago   Hyperlipidemia    Hypertension    Migraines    Past Surgical History:  Procedure Laterality Date   ABDOMINAL HYSTERECTOMY  1988   lt hand surgery  1985   Lt middle toe surgery  1977   OOPHORECTOMY Bilateral 08/05/2019   also removed tumors from L & R ovary; benign    ovarian tumor removal Left 08/05/2019   2 lb, benign; @ [facility]   Rt thumb joint replacement  1982   SALPINGECTOMY Bilateral 08/05/2019   vaginal delivery x3  1979, 1982, 1986   Social History: Nonsmoker. No alcohol use. No drug use.   Family History: family history includes Alzheimer's disease in her mother; Arthritis in her father and mother; Cancer in her maternal grandmother, paternal grandfather, and paternal grandmother; Diabetes in her brother and paternal grandfather; Healthy in her daughter, son, and son. Father is health. Paternal  grandfather possibly had stomach cancer. No known family history of colon cancer.   Allergies  Allergen Reactions   Inderal [Propranolol] Rash   Tomato Rash    Outpatient Encounter Medications as of 02/24/2021  Medication Sig   AJOVY 225 MG/1.5ML SOSY INJECT 225 MG INTO THE SKIN EVERY 30 (THIRTY) DAYS.   b complex vitamins tablet Take 1 tablet by mouth daily.   losartan-hydrochlorothiazide (HYZAAR) 50-12.5 MG tablet Take 1 tablet by mouth daily.   naproxen sodium (ALEVE) 220 MG tablet Take 220 mg by mouth as needed.   pravastatin (PRAVACHOL) 80 MG tablet Take 1 tablet (80 mg total) by mouth daily.   SUMAtriptan (IMITREX) 100 MG tablet TAKE 1 TABLET EVERY 2 (TWO) HOURS AS NEEDED FOR MIGRAINE. AS NEEDED FOR MIGRAINES   Vitamin D, Ergocalciferol, (DRISDOL) 1.25  MG (50000 UNIT) CAPS capsule TAKE 1 CAPSULE (50,000 UNITS TOTAL) BY MOUTH EVERY 7 (SEVEN) DAYS   [DISCONTINUED] BIOTIN PO Take by mouth daily. (Patient not taking: Reported on 02/24/2021)   [DISCONTINUED] co-enzyme Q-10 30 MG capsule Take 30 mg by mouth daily. (Patient not taking: Reported on 02/24/2021)   [DISCONTINUED] diltiazem 2 % GEL Apply to affected area 1-2 times daily (Patient not taking: Reported on 02/24/2021)   [DISCONTINUED] hydrocortisone (ANUSOL-HC) 25 MG suppository Place 1 suppository (25 mg total) rectally 2 (two) times daily. (Patient not taking: Reported on 02/24/2021)   No facility-administered encounter medications on file as of 02/24/2021.   REVIEW OF SYSTEMS:  Gen: Denies fever, sweats or chills. No weight loss.  CV: Denies chest pain, palpitations or edema. Resp: Denies cough, shortness of breath of hemoptysis.  GI: See HPI  GU : Denies urinary burning, blood in urine, increased urinary frequency or incontinence. MS: Denies joint pain, muscles aches or weakness. Derm: Denies rash, itchiness, skin lesions or unhealing ulcers. Psych: Denies depression, anxiety or memory loss. Heme: Denies bruising, bleeding. Neuro:  Denies headaches, dizziness or paresthesias. Endo:  Denies any problems with DM, thyroid or adrenal function.  PHYSICAL EXAM: BP 138/84    Pulse 68    Ht 5\' 4"  (1.626 m)    Wt 189 lb 2 oz (85.8 kg)    SpO2 100%    BMI 32.46 kg/m   General: 61 year old female in no acute distress. Head: Normocephalic and atraumatic. Eyes:  Sclerae non-icteric, conjunctive pink. Ears: Normal auditory acuity. Mouth: Dentition intact. No ulcers or lesions.  Neck: Supple, no lymphadenopathy or thyromegaly.  Lungs: Clear bilaterally to auscultation without wheezes, crackles or rhonchi. Heart: Regular rate and rhythm. No murmur, rub or gallop appreciated.  Abdomen: Soft, nontender, non distended. No masses. No hepatosplenomegaly. Normoactive bowel sounds x 4 quadrants.  Rectal:  Open anterior fissure with a sentinel tag, no active bleeding or exudate, area is mildly tender.  Internal hemorrhoids palpated without prolapse.  Brown stool filled the rectal vault.  CMA Melissa present during exam. Musculoskeletal: Symmetrical with no gross deformities. Skin: Warm and dry. No rash or lesions on visible extremities. Extremities: No edema. Neurological: Alert oriented x 4, no focal deficits.  Psychological:  Alert and cooperative. Normal mood and affect.  ASSESSMENT AND PLAN:  73) 61 year old female with rectal bleeding and anal pain.  Anterior anal fissure on exam.  Hemoglobin 14.2. -Restart Diltiazem 2% fissure cream + RectiCare OTC, apply a small amount 1 cm inside the anal opening into the external anal area 3 times daily for 6 to 8 weeks.  I explained to the patient the fissure ointment must be administered  inside the anal opening and to the external anal area tid, treatment typically takes at least 6 to 8 weeks to allow fissure healing. -Patient will contact her office if her symptoms worsen -Consider referral to general surgery for sphincterotomy if anal fissure does not heal -Colonoscopy benefits and risks discussed including risk with sedation, risk of bleeding, perforation and infection  -Patient requested to schedule colonoscopy next week while she is off work -Further recommendations to be determined after colonoscopy completed       CC:  [name], [place]

## 2021-02-24 NOTE — Patient Instructions (Addendum)
PROCEDURES: You have been scheduled for a colonoscopy. Please follow the written instructions given to you at your visit today. If you use inhalers (even only as needed), please bring them with you on the day of your procedure.  RECOMMENDATIONS: Miralax- Dissolve one capful in 8 ounces of water and drink before bed as needed to avoid straining. Purchase over the counter RectiCare (we have given you some samples today to try) to add to the fissure cream you already have and apply a small amount inside the anal opening and to the external anal area three times a day for 6-8 weeks.  It was great seeing you today! Thank you for entrusting me with your care and choosing [facility].  Noralyn Pick, CRNP  The [facility] providers would like to encourage you to use [facility] to communicate with providers for non-urgent requests or questions.  Due to long hold times on the telephone, sending your provider a message by [facility] may be faster and more efficient way to get a response. Please allow 48 business hours for a response.  Please remember that this is for non-urgent requests/questions.  If you are age 94 or older, your body mass index should be between 23-30. Your Body mass index is 32.46 kg/m. If this is out of the aforementioned range listed, please consider follow up with your Primary Care Provider.  If you are age 51 or younger, your body mass index should be between 19-25. Your Body mass index is 32.46 kg/m. If this is out of the aformentioned range listed, please consider follow up with your Primary Care Provider.

## 2021-02-27 NOTE — Progress Notes (Signed)
Agree with the assessment and plan as outlined by Colleen Kennedy-Smith, NP.   Brelee Renk, DO, FACG [facility]   

## 2021-02-28 ENCOUNTER — Encounter: Payer: Self-pay | Admitting: Gastroenterology

## 2021-02-28 ENCOUNTER — Ambulatory Visit (AMBULATORY_SURGERY_CENTER): Payer: BC Managed Care – PPO | Admitting: Gastroenterology

## 2021-02-28 VITALS — BP 112/55 | HR 64 | Temp 98.7°F | Resp 16 | Ht 64.0 in | Wt 189.0 lb

## 2021-02-28 DIAGNOSIS — K5730 Diverticulosis of large intestine without perforation or abscess without bleeding: Secondary | ICD-10-CM | POA: Diagnosis not present

## 2021-02-28 DIAGNOSIS — K641 Second degree hemorrhoids: Secondary | ICD-10-CM | POA: Diagnosis not present

## 2021-02-28 DIAGNOSIS — K625 Hemorrhage of anus and rectum: Secondary | ICD-10-CM

## 2021-02-28 DIAGNOSIS — K602 Anal fissure, unspecified: Secondary | ICD-10-CM

## 2021-02-28 MED ORDER — SODIUM CHLORIDE 0.9 % IV SOLN: 500.0000 mL | Freq: Once | INTRAVENOUS | Status: DC

## 2021-02-28 NOTE — Progress Notes (Signed)
Pt's states no medical or surgical changes since previsit or office visit. 

## 2021-02-28 NOTE — Patient Instructions (Addendum)
Handout on diverticulosis and hemorrhoids provided   Repeat colonoscopy in 10 years for screening purposes.   Follow up with Cottie Banda in GI office in 2 months or sooner if needed.  Continue fiber supplement to maintain soft stool without training to have bowel movments. If needed, can add laxative (ie. Miralax) to keep stool soft and promote fissure healing.  Sitz bath 2-4 times a day as able.   If fissure still not healing, can consider referral to Colo-Rectal Surgery. However,typically plan to treat with at least 6 weeks of topical therapy before consideration of surgical intervention for fissures.   YOU HAD AN ENDOSCOPIC PROCEDURE TODAY AT [facility]:   Refer to the procedure report that was given to you for any specific questions about what was found during the examination.  If the procedure report does not answer your questions, please call your gastroenterologist to clarify.  If you requested that your care partner not be given the details of your procedure findings, then the procedure report has been included in a sealed envelope for you to review at your convenience later.  YOU SHOULD EXPECT: Some feelings of bloating in the abdomen. Passage of more gas than usual.  Walking can help get rid of the air that was put into your GI tract during the procedure and reduce the bloating. If you had a lower endoscopy (such as a colonoscopy or flexible sigmoidoscopy) you may notice spotting of blood in your stool or on the toilet paper. If you underwent a bowel prep for your procedure, you may not have a normal bowel movement for a few days.  Please Note:  You might notice some irritation and congestion in your nose or some drainage.  This is from the oxygen used during your procedure.  There is no need for concern and it should clear up in a day or so.  SYMPTOMS TO REPORT IMMEDIATELY:  Following lower endoscopy (colonoscopy or flexible sigmoidoscopy):  Excessive amounts  of blood in the stool  Significant tenderness or worsening of abdominal pains  Swelling of the abdomen that is new, acute  Fever of 100F or higher   For urgent or emergent issues, a gastroenterologist can be reached at any hour by calling [phone]. Do not use MyChart messaging for urgent concerns.    DIET:  We do recommend a small meal at first, but then you may proceed to your regular diet.  Drink plenty of fluids but you should avoid alcoholic beverages for 24 hours.  ACTIVITY:  You should plan to take it easy for the rest of today and you should NOT DRIVE or use heavy machinery until tomorrow (because of the sedation medicines used during the test).    FOLLOW UP: Our staff will call the number listed on your records 48-72 hours following your procedure to check on you and address any questions or concerns that you may have regarding the information given to you following your procedure. If we do not reach you, we will leave a message.  We will attempt to reach you two times.  During this call, we will ask if you have developed any symptoms of COVID 19. If you develop any symptoms (ie: fever, flu-like symptoms, shortness of breath, cough etc.) before then, please call [phone].  If you test positive for Covid 19 in the 2 weeks post procedure, please call and report this information to [place].    If any biopsies were taken you will be contacted by phone  or by letter within the next 1-3 weeks.  Please call us at [phone] if you have not heard about the biopsies in 3 weeks.    SIGNATURES/CONFIDENTIALITY: You and/or your care partner have signed paperwork which will be entered into your electronic medical record.  These signatures attest to the fact that that the information above on your After Visit Summary has been reviewed and is understood.  Full responsibility of the confidentiality of this discharge information lies with you and/or your care-partner.

## 2021-02-28 NOTE — Progress Notes (Signed)
A and O x3. Report to RN. Tolerated MAC anesthesia well.

## 2021-02-28 NOTE — Progress Notes (Signed)
GASTROENTEROLOGY PROCEDURE H&P NOTE   Primary Care Physician: [name], PA    Reason for Procedure:  Hematochezia, anal fissure  Plan:    Colonoscopy  Patient is appropriate for endoscopic procedure(s) in the ambulatory ([place]) setting.  The nature of the procedure, as well as the risks, benefits, and alternatives were carefully and thoroughly reviewed with the patient. Ample time for discussion and questions allowed. The patient understood, was satisfied, and agreed to proceed.     HPI: Alison Mahoney is a 61 y.o. female who presents for colonoscopy for evaluation of hematochezia and persistent anal fissure.  Patient was most recently seen in the Gastroenterology Clinic on 02/24/2021 by Carl Best, NP.  Fissure currently being treated with diltiazem 2%, RectiCare and presents today for expedited colonoscopy.  No interval change in medical history since that appointment. Please refer to that note for full details regarding GI history and clinical presentation.   Past Medical History:  Diagnosis Date   Ankylosing spondylitis ([place])    35 years ago   Hyperlipidemia    Hypertension    Migraines     Past Surgical History:  Procedure Laterality Date   ABDOMINAL HYSTERECTOMY  1988   lt hand surgery  1985   Lt middle toe surgery  1977   OOPHORECTOMY Bilateral 08/05/2019   also removed tumors from L & R ovary; benign    ovarian tumor removal Left 08/05/2019   2 lb, benign; @ [facility]   Rt thumb joint replacement  1982   SALPINGECTOMY Bilateral 08/05/2019   vaginal delivery x3  1979, 1982, 1986    Prior to Admission medications   Medication Sig Start Date End Date Taking? Authorizing Provider  b complex vitamins tablet Take 1 tablet by mouth daily.   Yes [provider]  losartan-hydrochlorothiazide (HYZAAR) 50-12.5 MG tablet Take 1 tablet by mouth daily. 01/09/21  Yes [name], PA  naproxen sodium (ALEVE) 220 MG tablet Take 220 mg by mouth as  needed.   Yes [provider]  pravastatin (PRAVACHOL) 80 MG tablet Take 1 tablet (80 mg total) by mouth daily. 11/10/20  Yes Worley, Aldona Bar, PA  SUMAtriptan (IMITREX) 100 MG tablet TAKE 1 TABLET EVERY 2 (TWO) HOURS AS NEEDED FOR MIGRAINE. AS NEEDED FOR MIGRAINES 12/12/20  Yes [name], PA  Vitamin D, Ergocalciferol, (DRISDOL) 1.25 MG (50000 UNIT) CAPS capsule TAKE 1 CAPSULE (50,000 UNITS TOTAL) BY MOUTH EVERY 7 (SEVEN) DAYS 11/10/20  Yes Worley, Samantha, PA  AJOVY 225 MG/1.5ML SOSY INJECT 225 MG INTO THE SKIN EVERY 30 (THIRTY) DAYS. 12/07/20   Melvenia Beam, MD    Current Outpatient Medications  Medication Sig Dispense Refill   b complex vitamins tablet Take 1 tablet by mouth daily.     losartan-hydrochlorothiazide (HYZAAR) 50-12.5 MG tablet Take 1 tablet by mouth daily. 90 tablet 1   naproxen sodium (ALEVE) 220 MG tablet Take 220 mg by mouth as needed.     pravastatin (PRAVACHOL) 80 MG tablet Take 1 tablet (80 mg total) by mouth daily. 90 tablet 1   SUMAtriptan (IMITREX) 100 MG tablet TAKE 1 TABLET EVERY 2 (TWO) HOURS AS NEEDED FOR MIGRAINE. AS NEEDED FOR MIGRAINES 9 tablet 4   Vitamin D, Ergocalciferol, (DRISDOL) 1.25 MG (50000 UNIT) CAPS capsule TAKE 1 CAPSULE (50,000 UNITS TOTAL) BY MOUTH EVERY 7 (SEVEN) DAYS 12 capsule 0   AJOVY 225 MG/1.5ML SOSY INJECT 225 MG INTO THE SKIN EVERY 30 (THIRTY) DAYS. 1.5 mL 5   Current Facility-Administered  Medications  Medication Dose Route Frequency Provider Last Rate Last Admin   0.9 %  sodium chloride infusion  500 mL Intravenous Once Cordaro Mukai V, DO        Allergies as of 02/28/2021 - Review Complete 02/28/2021  Allergen Reaction Noted   Inderal [propranolol] Rash 04/14/2018   Tomato Rash 07/28/2019    Family History  Problem Relation Age of Onset   Arthritis Mother    Alzheimer's disease Mother    Arthritis Father    Diabetes Brother    Cancer Maternal Grandmother    Cancer Paternal Grandmother    Cancer  Paternal Grandfather    Diabetes Paternal Merchant navy officer    Healthy Daughter    Healthy Son    Healthy Son    Colon cancer Neg Hx    Esophageal cancer Neg Hx    Pancreatic cancer Neg Hx    Stomach cancer Neg Hx     Social History   Socioeconomic History   Marital status: Single    Spouse name: Not on file   Number of children: 3   Years of education: Not on file   Highest education level: Not on file  Occupational History   Not on file  Tobacco Use   Smoking status: Never   Smokeless tobacco: Never  Vaping Use   Vaping Use: Never used  Substance and Sexual Activity   Alcohol use: Not Currently   Drug use: Not Currently   Sexual activity: Not Currently  Other Topics Concern   Not on file  Social History Narrative   Moved from [place] in [place]; 3 children   Currently works as a Education officer, museum      Lives alone   Right handed   Caffeine: 1 cup/day, rarely may have a second cup   Social Determinants of Radio broadcast assistant Strain: Not on file  Food Insecurity: Not on file  Transportation Needs: Not on file  Physical Activity: Not on file  Stress: Not on file  Social Connections: Not on file  Intimate Partner Violence: Not on file    Physical Exam: Vital signs in last 24 hours: @BP  137/75    Pulse 100    Temp 98.7 F (37.1 C) (Temporal)    Ht 5\' 4"  (1.626 m)    Wt 189 lb (85.7 kg)    SpO2 100%    BMI 32.44 kg/m  GEN: NAD EYE: Sclerae anicteric ENT: MMM CV: Non-tachycardic Pulm: CTA b/l GI: Soft, NT/ND NEURO:  Alert & Oriented x [place], DO [facility]   02/28/2021 3:31 PM

## 2021-02-28 NOTE — Op Note (Signed)
[place] Patient Name: Alison Mahoney Procedure Date: 02/28/2021 3:32 PM MRN: [id number] Endoscopist: Gerrit Heck , MD Age: 61 Referring MD:  Date of Birth: 02-27-1960 Gender: Female Account #: [id number] Procedure:                Colonoscopy Indications:              Hematochezia, Perianal fissure                           Last colonoscopy was approximately 16 years ago. Medicines:                Monitored Anesthesia Care Procedure:                Pre-Anesthesia Assessment:                           - Prior to the procedure, a History and Physical                            was performed, and patient medications and                            allergies were reviewed. The patient's tolerance of                            previous anesthesia was also reviewed. The risks                            and benefits of the procedure and the sedation                            options and risks were discussed with the patient.                            All questions were answered, and informed consent                            was obtained. Prior Anticoagulants: The patient has                            taken no previous anticoagulant or antiplatelet                            agents. ASA Grade Assessment: II - A patient with                            mild systemic disease. After reviewing the risks                            and benefits, the patient was deemed in                            satisfactory condition to undergo the procedure.  After obtaining informed consent, the colonoscope                            was passed under direct vision. Throughout the                            procedure, the patient's blood pressure, pulse, and                            oxygen saturations were monitored continuously. The                            Olympus CF-HQ190L ([id number]) Colonoscope was                            introduced through the anus and  advanced to the the                            terminal ileum. The colonoscopy was performed                            without difficulty. The patient tolerated the                            procedure well. The quality of the bowel                            preparation was good. The terminal ileum, ileocecal                            valve, appendiceal orifice, and rectum were                            photographed. Scope In: 3:42:19 PM Scope Out: 3:58:04 PM Scope Withdrawal Time: 0 hours 11 minutes 4 seconds  Total Procedure Duration: 0 hours 15 minutes 45 seconds  Findings:                 A single, small anal fissure was found on perianal                            exam. Additionally, skin tags and small grade II                            internal hemorrhoids were seen on perianal exam.                           Two medium-mouthed diverticula were found in the                            sigmoid colon.                           Non-bleeding internal hemorrhoids were found during  retroflexion. The hemorrhoids were small.                           The exam was otherwise normal throughout the                            remainder of the colon. The mucosa was normal                            appearing without areas of erythema, edema,                            ulceration, or erosions.                           The terminal ileum appeared normal. Complications:            No immediate complications. Estimated Blood Loss:     Estimated blood loss: none. Impression:               - Anal fissure found on perianal exam and on                            retroflexed views.                           - Mild diverticulosis in the sigmoid colon.                           - Non-bleeding internal hemorrhoids.                           - The examined portion of the ileum was normal.                           - No specimens collected. Recommendation:           -  Patient has a contact number available for                            emergencies. The signs and symptoms of potential                            delayed complications were discussed with the                            patient. Return to normal activities tomorrow.                            Written discharge instructions were provided to the                            patient.                           - Resume previous diet.                           -  Continue present medications.                           - Repeat colonoscopy in 10 years for screening                            purposes.                           - Follow-up with Carl Best in the GI                            office in 2 months, or sooner as needed.                           - Continue topical Diltiazem 2% TID for 6-8 weeks.                           - Continue fiber supplement to maintain soft stools                            without straining to have a bowel movement. If                            needed, can add laxative (ie, Miralax), to keep                            sttols soft and promote fissure healing.                           - Sitz baths 2-4 times daily as able.                           - If fissure still not healing, can consider                            referral to Colo-Rectal Surgery. However, typically                            plan to treat with at least 6 weeks of topical                            therapy before consideration of surgical                            intervention for fissures. Gerrit Heck, MD 02/28/2021 4:08:52 PM

## 2021-03-01 ENCOUNTER — Telehealth: Payer: Self-pay

## 2021-03-01 NOTE — Telephone Encounter (Signed)
Per 02/28/21 - Follow up with Carl Best, NP in the GI office in 2 months, or sooner as needed.   March schedule is not available for APP's at this time. Appt reminder sent to [facility] nurse, [place].

## 2021-03-02 ENCOUNTER — Telehealth: Payer: Self-pay

## 2021-03-02 NOTE — Telephone Encounter (Signed)
°  Follow up Call-  Call back number 02/28/2021  Post procedure Call Back phone  # [phone]  Permission to leave phone message Yes  Some recent data might be hidden     Patient questions:  Do you have a fever, pain , or abdominal swelling? No. Pain Score  0 *  Have you tolerated food without any problems? Yes.    Have you been able to return to your normal activities? Yes.    Do you have any questions about your discharge instructions: Diet   No. Medications  No. Follow up visit  No.  Do you have questions or concerns about your Care? No.  Actions: * If pain score is 4 or above: No action needed, pain <4.  Have you developed a fever since your procedure? no  2.   Have you had an respiratory symptoms (SOB or cough) since your procedure? no  3.   Have you tested positive for COVID 19 since your procedure no  4.   Have you had any family members/close contacts diagnosed with the COVID 19 since your procedure?  no   If yes to any of these questions please route to Joylene John, RN and Joella Prince, RN

## 2021-03-23 ENCOUNTER — Telehealth: Payer: Self-pay | Admitting: Physician Assistant

## 2021-03-23 NOTE — Telephone Encounter (Signed)
Pt states her eyelid had a burning sensation and what looks like a pea-sized blister. The burning sensation seems to be getting worse. Sent to Triage. Waiting on notes

## 2021-03-23 NOTE — Telephone Encounter (Signed)
Please call pt and schedule an appointment today with a provider, do not need to wait on Triage notes.

## 2021-03-23 NOTE — Telephone Encounter (Signed)
Called pt and left VM to call our office to schedule an appt.  Patient Name: Alison Mahoney Gender: Female DOB: 01/19/1961 Age: 61 Y 29 M 2 D Return Phone Number: [phone] (Primary) Address: City/ State/ Zip: [place]  [address] Client [facility] Client Site [facility] Provider Morene Rankins, [place] Contact Type Call Who Is Calling Patient / Member / Family / Caregiver Call Type Triage / Clinical Relationship To Patient Self Return Phone Number [phone] (Primary) Chief Complaint Eye Pain Reason for Call Symptomatic / Request for Health Information Initial Comment Call sent from office. Caller states her eyelid started stinging and burning yesterday. Today it looks like a little blister or scratch on the inside corner of her eyelid. No vision problems. Who does she need to see? Her provider is out of office. Translation No Nurse Assessment Nurse: Zenia Resides, RN, Diane Date/Time Eilene Ghazi Time): 03/23/2021 8:58:44 AM Confirm and document reason for call. If symptomatic, describe symptoms. ---Caller states her eyelid started stinging and burning yesterday. Today it looks like a little blister or scratch on the inside corner of her eyelid. No new eye products. Eye is a little puffy. No drainage. Does the patient have any new or worsening symptoms? ---Yes Will a triage be completed? ---Yes Related visit to physician within the last 2 weeks? ---No Does the PT have any chronic conditions? (i.e. diabetes, asthma, this includes High risk factors for pregnancy, etc.) ---Yes List chronic conditions. ---migraines Is this a behavioral health or substance abuse call? ---No Guidelines Guideline Title Affirmed Question Affirmed Notes Nurse Date/Time Eilene Ghazi Time) Sty Sty on Earleen Newport, RN, Diane 03/23/2021 9:02:45 AM Disp. Time Eilene Ghazi Time) Disposition Final User 03/23/2021 9:06:08 AM Home Care Yes Zenia Resides,  RN, Diane Caller Disagree/Comply Comply Caller Understands Yes PreDisposition Call Doctor Care Advice Given Per Guideline HOME CARE: * You should be able to treat this at home. REASSURANCE AND EDUCATION: * A sty usually comes to a head and forms a pimple in 3 to 5 days. * In a few more days, it usually drains and heals. * Usually it gets better and goes away in 10 to 14 days. LOCAL HEAT: * Apply a warm, wet washcloth to the eye for 10 minutes 4 times a day to help the sty come to a head. * Continue to cleanse the eye with warm water several times a day even after the sty begins to drain. PULLING THE EYELASH (IF THERE IS A PIMPLE): * If it looks like there is a pimple centered around an eyelash, pulling the eyelash may help the sty drain. * Use a tweezers. * Caution: Do not squeeze the red lump. CALL BACK IF: * Eyelid becomes red or swollen * Sty is not better by 5 days * You become worse CARE ADVICE given per Sty (Adult) guideline.

## 2021-03-23 NOTE — Telephone Encounter (Signed)
Noted  

## 2021-03-23 NOTE — Telephone Encounter (Signed)
Pt is scheduled with Dr Cherlynn Kaiser on 03/24/21 @ 2pm.

## 2021-03-23 NOTE — Telephone Encounter (Signed)
Left message on voicemail to call office.  

## 2021-03-24 ENCOUNTER — Encounter: Payer: Self-pay | Admitting: Family Medicine

## 2021-03-24 ENCOUNTER — Ambulatory Visit: Payer: BC Managed Care – PPO | Admitting: Family Medicine

## 2021-03-24 ENCOUNTER — Other Ambulatory Visit: Payer: Self-pay

## 2021-03-24 VITALS — BP 120/80 | HR 79 | Temp 98.7°F | Ht 64.0 in | Wt 189.0 lb

## 2021-03-24 DIAGNOSIS — H5712 Ocular pain, left eye: Secondary | ICD-10-CM

## 2021-03-24 MED ORDER — VALACYCLOVIR HCL 1 G PO TABS: 1000.0000 mg | ORAL_TABLET | Freq: Two times a day (BID) | ORAL | 0 refills | Status: AC

## 2021-03-24 NOTE — Progress Notes (Signed)
Subjective:     Patient ID: Alison Mahoney, female    DOB: [birth year], 61 y.o.   MRN: [id number]  Chief Complaint  Patient presents with   c/o pimple    Pt is c/o pimple on left eyelid x 3 days, burns and stings. Has been doing warm compresses.    HPI Past few days-irrit L upper eyelid.  Burns and stings.  Sees a "pimple" type bump on it.  Has been using some warm compresses.  Had both shingrix about 1 yr ago. Does have problems w/flaking skin eyebrows and scalp so will use dandruff shampoo in areas.   No new visual changes-seeing opt next wk  Health Maintenance Due  Topic Date Due   DEXA SCAN  Never done    Past Medical History:  Diagnosis Date   Ankylosing spondylitis ([place])    35 years ago   Hyperlipidemia    Hypertension    Migraines     Past Surgical History:  Procedure Laterality Date   ABDOMINAL HYSTERECTOMY  1988   lt hand surgery  1985   Lt middle toe surgery  1977   OOPHORECTOMY Bilateral 08/05/2019   also removed tumors from L & R ovary; benign    ovarian tumor removal Left 08/05/2019   2 lb, benign; @ [facility]   Rt thumb joint replacement  1982   SALPINGECTOMY Bilateral 08/05/2019   vaginal delivery x3  1979, 1982, 1986    Outpatient Medications Prior to Visit  Medication Sig Dispense Refill   AJOVY 225 MG/1.5ML SOSY INJECT 225 MG INTO THE SKIN EVERY 30 (THIRTY) DAYS. 1.5 mL 5   b complex vitamins tablet Take 1 tablet by mouth daily.     losartan-hydrochlorothiazide (HYZAAR) 50-12.5 MG tablet Take 1 tablet by mouth daily. 90 tablet 1   naproxen sodium (ALEVE) 220 MG tablet Take 220 mg by mouth as needed.     pravastatin (PRAVACHOL) 80 MG tablet Take 1 tablet (80 mg total) by mouth daily. 90 tablet 1   SUMAtriptan (IMITREX) 100 MG tablet TAKE 1 TABLET EVERY 2 (TWO) HOURS AS NEEDED FOR MIGRAINE. AS NEEDED FOR MIGRAINES 9 tablet 4   Vitamin D, Ergocalciferol, (DRISDOL) 1.25 MG (50000 UNIT) CAPS capsule TAKE 1 CAPSULE (50,000 UNITS TOTAL) BY MOUTH EVERY 7 (SEVEN)  DAYS 12 capsule 0   No facility-administered medications prior to visit.    Allergies  Allergen Reactions   Inderal [Propranolol] Rash   Tomato Rash   YIR:[id number] except as noted in HPI Anal fissure-still using ointment.      Objective:     BP 120/80 (BP Location: Left Arm, Patient Position: Sitting, Cuff Size: Large)    Pulse 79    Temp 98.7 F (37.1 C) (Temporal)    Ht 5\' 4"  (1.626 m)    Wt 189 lb (85.7 kg)    SpO2 97%    BMI 32.44 kg/m  Wt Readings from Last 3 Encounters:  03/24/21 189 lb (85.7 kg)  02/28/21 189 lb (85.7 kg)  02/24/21 189 lb 2 oz (85.8 kg)        Gen: WDWN NAD WF HEENT: NCAT, conjunctiva not injected, sclera nonicteric, EOMI.  L upper eyelid-tiny white papule but sev smaller ones more medially.  Irrit.  Hard to tell if vessicular.  TM WNL B, OP moist, no exudates  NECK:  supple, no thyromegaly, no nodes, MSK: no gross abnormalities.  NEURO: A&O x3.  CN II-XII intact.  PSYCH: normal mood. Good eye contact  Assessment &  Plan:   Problem List Items Addressed This Visit   None Visit Diagnoses     Eye pain, left    -  Primary      L upper eyelid irrt-? Stye starting, ? Eczema, ? HSV(burning).  Will tx w/Valtrex.  Cont soaks.  Sees opt next wk-pt will wait on HC oint in case eczema.    Meds ordered this encounter  Medications   valACYclovir (VALTREX) 1000 MG tablet    Sig: Take 1 tablet (1,000 mg total) by mouth 2 (two) times daily for 10 days.    Dispense:  20 tablet    Refill:  0    Wellington Hampshire, MD

## 2021-03-24 NOTE — Patient Instructions (Signed)
Meds have been sent the the pharmacy You can take tylenol for pain/fevers

## 2021-03-26 ENCOUNTER — Other Ambulatory Visit: Payer: Self-pay | Admitting: Physician Assistant

## 2021-03-27 ENCOUNTER — Other Ambulatory Visit: Payer: Self-pay | Admitting: General Surgery

## 2021-03-27 ENCOUNTER — Telehealth: Payer: Self-pay | Admitting: Gastroenterology

## 2021-03-27 MED ORDER — DILTIAZEM GEL 2 %: 1.0000 "application " | Freq: Two times a day (BID) | CUTANEOUS | 2 refills | Status: DC

## 2021-03-27 NOTE — Telephone Encounter (Signed)
Okay to refill diltizem per Dr Bryan Lemma

## 2021-03-27 NOTE — Telephone Encounter (Signed)
Inbound call from patient requesting refills for diltiazem 2% ointment please.  Please advise.     

## 2021-03-27 NOTE — Telephone Encounter (Signed)
Faxed order to [facility]. Left a voicemail for patient that it was sent.  [facility]'s information is below: Address: [address] [place], [place] [address]  [phone]  *Please DO NOT go directly from our office to pick up this medication! Give the pharmacy 1 day to process the prescription as this is compounded and takes time to make.

## 2021-03-28 NOTE — Progress Notes (Unsigned)
No chief complaint on file.    HISTORY OF PRESENT ILLNESS: 03/28/21 ALL:  Alison Mahoney returns for follow up for migraines. She continues Ajovy and sumatriptan.   03/28/2020 ALL:  Alison Mahoney is a 61 y.o. female here today for follow up for migraines. She continues Ajovy and sumatriptan. She feels that she is doing well. Baseline was 15-20 migraine days, now 3-5. Headaches are not as severe and do not last as long. She does say that she tends to wake up with migraines. Once she gets up and gets ready, headache resolves. She denies snoring. No daytime sleepiness or dry mouth. She does note that her blood pressure fluctuates. She is on losartan/hctz 50-12.5mg  (reduced from 100/25 in June, 2021). She is watching her diet closely.    HISTORY (copied from Dr Cathren Laine previous note)   HPI:  Alison Mahoney is a 61 y.o. female here as requested by [name], PA for frequent headaches.PMHx migraines, HTN, HD, Ankylosing spondylitis.  I reviewed Alison Mahoney's notes: She has tried multiple preventatives, had "all sorts of work up" at the [employer] [employer], has not had good relief. Tried: Topamax, elavil, b-blocker. From a thorough review of records, meds tried include: flexeril, diclofenac, naproxen, imitrex,inderal,  topamax, b-blocker, elevil. After review of Epic and Care Everywhere I do not see any brain imaging available.    She has had migraines since high school, they have not improved in menopause, stable frequency and severity, over years she has had many medications, Hers can last 2-3 days, Imitrex works the best acutely, right side mostly can also be on the left side, around the eye above the eye, they feel hot, throbbing, searing, photophobia the most, phonophobia, nausea, movement makes it worse, sometimes she can;t get out of the bed, a cold cloth helps. They can be moderately severe to severe. At least 15 migraine days a month. No medication overuse. She does not have aura  frequently, maybe some word-finding problems. She can wake with headaches, she can have vision changes. But she does not think she has any new qualities, No other focal neurologic deficits, associated symptoms, inciting events or modifiable factors.   Reviewed notes, labs and imaging from outside physicians, which showed:   Cbc normal, CMP unremarkable(low platelets) 04/2019   REVIEW OF SYSTEMS: Out of a complete 14 system review of symptoms, the patient complains only of the following symptoms, headaches and all other reviewed systems are negative.    ALLERGIES: Allergies  Allergen Reactions   Inderal [Propranolol] Rash   Tomato Rash     HOME MEDICATIONS: Outpatient Medications Prior to Visit  Medication Sig Dispense Refill   AJOVY 225 MG/1.5ML SOSY INJECT 225 MG INTO THE SKIN EVERY 30 (THIRTY) DAYS. 1.5 mL 5   b complex vitamins tablet Take 1 tablet by mouth daily.     diltiazem 2 % GEL Apply 1 application topically 2 (two) times daily. Apply per rectum twice daily 30 g 2   losartan-hydrochlorothiazide (HYZAAR) 50-12.5 MG tablet Take 1 tablet by mouth daily. 90 tablet 1   naproxen sodium (ALEVE) 220 MG tablet Take 220 mg by mouth as needed.     pravastatin (PRAVACHOL) 80 MG tablet TAKE 1 TABLET BY MOUTH EVERY DAY 90 tablet 1   SUMAtriptan (IMITREX) 100 MG tablet TAKE 1 TABLET EVERY 2 (TWO) HOURS AS NEEDED FOR MIGRAINE. AS NEEDED FOR MIGRAINES 9 tablet 4   valACYclovir (VALTREX) 1000 MG tablet Take 1 tablet (1,000 mg total) by mouth  2 (two) times daily for 10 days. 20 tablet 0   Vitamin D, Ergocalciferol, (DRISDOL) 1.25 MG (50000 UNIT) CAPS capsule TAKE 1 CAPSULE (50,000 UNITS TOTAL) BY MOUTH EVERY 7 (SEVEN) DAYS 12 capsule 0   No facility-administered medications prior to visit.     PAST MEDICAL HISTORY: Past Medical History:  Diagnosis Date   Ankylosing spondylitis ([place])    35 years ago   Hyperlipidemia    Hypertension    Migraines      PAST SURGICAL HISTORY: Past  Surgical History:  Procedure Laterality Date   ABDOMINAL HYSTERECTOMY  1988   lt hand surgery  1985   Lt middle toe surgery  1977   OOPHORECTOMY Bilateral 08/05/2019   also removed tumors from L & R ovary; benign    ovarian tumor removal Left 08/05/2019   2 lb, benign; @ [facility]   Rt thumb joint replacement  1982   SALPINGECTOMY Bilateral 08/05/2019   vaginal delivery x3  1979, 1982, 1986     FAMILY HISTORY: Family History  Problem Relation Age of Onset   Arthritis Mother    Alzheimer's disease Mother    Arthritis Father    Diabetes Brother    Cancer Maternal Grandmother    Cancer Paternal Grandmother    Cancer Paternal Grandfather    Diabetes Paternal Merchant navy officer    Healthy Daughter    Healthy Son    Healthy Son    Colon cancer Neg Hx    Esophageal cancer Neg Hx    Pancreatic cancer Neg Hx    Stomach cancer Neg Hx      SOCIAL HISTORY: Social History   Socioeconomic History   Marital status: Single    Spouse name: Not on file   Number of children: 3   Years of education: Not on file   Highest education level: Not on file  Occupational History   Not on file  Tobacco Use   Smoking status: Never   Smokeless tobacco: Never  Vaping Use   Vaping Use: Never used  Substance and Sexual Activity   Alcohol use: Not Currently   Drug use: Not Currently   Sexual activity: Not Currently  Other Topics Concern   Not on file  Social History Narrative   Moved from 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  There is no height or weight on file to calculate BMI.   Generalized: Well developed, in no acute distress  Cardiology: normal rate and rhythm, no murmur auscultated  Respiratory: clear to auscultation bilaterally    Neurological examination  Mentation: Alert oriented to time, place, history taking. Follows all commands speech and language fluent Cranial nerve II-XII: Pupils were equal round reactive to light. Extraocular movements were full, visual field were full on confrontational test. Facial sensation and strength were normal. Head turning and shoulder shrug  were normal and symmetric. Motor: The motor testing reveals 5 over 5  strength of all 4 extremities. Good symmetric motor tone is noted throughout.  Sensory: Sensory testing is intact to soft touch on all 4 extremities. No evidence of extinction is noted.  Coordination: Cerebellar testing reveals good finger-nose-finger and heel-to-shin bilaterally.  Gait and station: Gait is normal.  Reflexes: Deep tendon reflexes are symmetric and normal bilaterally.     DIAGNOSTIC DATA (LABS, IMAGING, TESTING) - I reviewed patient records, labs, notes, testing and imaging myself where available.  Lab Results  Component Value Date   WBC 5.8 01/25/2021   HGB 14.2 01/25/2021   HCT 42.3 01/25/2021   MCV 88.8 01/25/2021   PLT 216.0 01/25/2021      Component Value Date/Time   NA 138 11/09/2020 0906   K 3.4 (L) 11/09/2020 0906   CL 102 11/09/2020 0906   CO2 26 11/09/2020 0906   GLUCOSE 93 11/09/2020 0906   BUN 12 11/09/2020 0906   CREATININE 0.63 11/09/2020 0906   CREATININE 0.73 12/16/2019 0804   CALCIUM 9.6 11/09/2020 0906   PROT 7.4 11/09/2020 0906   ALBUMIN 4.6 11/09/2020 0906   AST 22 11/09/2020 0906   ALT 12 11/09/2020 0906   ALKPHOS 78 11/09/2020 0906   BILITOT 0.8 11/09/2020 0906   GFRNONAA 95 04/29/2019 1412   GFRAA 110 04/29/2019 1412   Lab Results  Component Value Date   CHOL 194  11/09/2020   HDL 54.20 11/09/2020   LDLCALC 116 (H) 11/09/2020   TRIG 120.0 11/09/2020   CHOLHDL 4 11/09/2020   No results found for: HGBA1C No results found for: VITAMINB12 No results found for: TSH  No flowsheet data found.   No flowsheet data found.   ASSESSMENT AND PLAN  61 y.o. year old female  has a past medical history of Ankylosing spondylitis ([place]), Hyperlipidemia, Hypertension, and Migraines. here with   No diagnosis found.  Pamalee is doing well, today. She reports improvement in severity and frequency of migraines on Ajovy and sumatriptan. We will continue current treatment plan. She will continue to monitor blood pressure closely at home. Healthy lifestyle habits and regular follow up with PCP advised. She will follow up with me in 1 year, sooner if needed.   No orders of the defined types were placed in this encounter.    No orders of the defined types were placed in this encounter.      Debbora Presto, MSN, FNP-C 03/28/2021, 8:32 AM  [facility] [address], [place], [place] [address] [phone]

## 2021-03-28 NOTE — Patient Instructions (Incomplete)

## 2021-03-29 ENCOUNTER — Ambulatory Visit: Payer: BC Managed Care – PPO | Admitting: Family Medicine

## 2021-03-29 ENCOUNTER — Telehealth: Payer: Self-pay | Admitting: Neurology

## 2021-03-29 DIAGNOSIS — G43709 Chronic migraine without aura, not intractable, without status migrainosus: Secondary | ICD-10-CM

## 2021-03-29 NOTE — Telephone Encounter (Signed)
Cover My Meds Ajovy Initiated for PA. Alison Mahoney Key: [id number] - PA Case ID: [id number] - Rx #: [id number]. Placed as urgent.  Determination pending.

## 2021-03-30 NOTE — Telephone Encounter (Signed)
Received [facility] CM approval for AJOVY 225mg /1.12ml.  03-29-2021 thru 03-29-2022.  PA# [id number] OQ.

## 2021-04-14 ENCOUNTER — Ambulatory Visit: Payer: BC Managed Care – PPO | Admitting: Nurse Practitioner

## 2021-04-14 ENCOUNTER — Encounter: Payer: Self-pay | Admitting: Nurse Practitioner

## 2021-04-14 VITALS — BP 114/72 | HR 74 | Ht 64.0 in | Wt 188.2 lb

## 2021-04-14 DIAGNOSIS — K602 Anal fissure, unspecified: Secondary | ICD-10-CM

## 2021-04-14 NOTE — Patient Instructions (Signed)
RECOMMENDATIONS: ? ?Avoid constipation. ?Please contact our office if rectal pain or bleeding recurs. ? ?Thank you for trusting me with your gastrointestinal care!   ? ?Noralyn Pick, CRNP ? ? ? ?BMI: ? ?If you are age 63 or older, your body mass index should be between 23-30. Your Body mass index is 32.31 kg/m?Marland Kitchen If this is out of the aforementioned range listed, please consider follow up with your Primary Care Provider. ? ?If you are age 39 or younger, your body mass index should be between 19-25. Your Body mass index is 32.31 kg/m?Marland Kitchen If this is out of the aformentioned range listed, please consider follow up with your Primary Care Provider.  ? ?MY CHART: ? ?The [place] [facility] providers would like to encourage you to use [facility] to communicate with providers for non-urgent requests or questions.  Due to long hold times on the telephone, sending your provider a message by [facility] may be a faster and more efficient way to get a response.  Please allow 48 business hours for a response.  Please remember that this is for non-urgent requests.  ? ? ?

## 2021-04-14 NOTE — Progress Notes (Addendum)
? ? ? ?04/14/2021 ?NIKIE CID ?[id number] ?Apr 14, 1960 ? ? ?Chief Complaint: Anal fissure follow-up ? ?History of Present Illness: Alison Mahoney is a 61 year old female with a past medical history of hypertension, hyperlipidemia, migraine headaches and ankylosing spondylitis. I initially saw Ms. Mehlman in the office on 02/24/2021 due to having rectal bleeding. She was treated with Diltiazem 2% fissure cream for an anterior anal fissure.  She underwent a colonoscopy by Dr. Bryan Lemma 02/28/2021 which showed an anal fissure, mild diverticulosis to the sigmoid colon and nonbleeding internal hemorrhoids.  No polyps.  She was advised to repeat a colonoscopy in 10 years.  She presents today for further follow-up.  She denies having any rectal bleeding since her colonoscopy.  She feels the anal tags around the anal area but denies having any anorectal discomfort or pain.  She is drinking adequate amounts of water.  She eats a high-fiber diet with several portions of vegetables and fruits daily.  She sometimes develops constipation after eating greens.  She denies having any abdominal pain.  No other complaints at this time. ? ?CBC Latest Ref Rng & Units 01/25/2021 11/09/2020 04/27/2020  ?WBC 4.0 - 10.5 K/uL 5.8 5.6 5.6  ?Hemoglobin 12.0 - 15.0 g/dL 14.2 15.2(H) 14.1  ?Hematocrit 36.0 - 46.0 % 42.3 46.3(H) 42.4  ?Platelets 150.0 - 400.0 K/uL 216.0 216.0 244.0  ?  ? ?CMP Latest Ref Rng & Units 11/09/2020 04/27/2020 12/16/2019  ?Glucose 70 - 99 mg/dL 93 104(H) 115(H)  ?BUN 6 - 23 mg/dL 12 15 11   ?Creatinine 0.40 - 1.20 mg/dL 0.63 0.66 0.73  ?Sodium 135 - 145 mEq/L 138 141 142  ?Potassium 3.5 - 5.1 mEq/L 3.4(L) 4.0 4.3  ?Chloride 96 - 112 mEq/L 102 102 104  ?CO2 19 - 32 mEq/L 26 31 33(H)  ?Calcium 8.4 - 10.5 mg/dL 9.6 9.5 9.8  ?Total Protein 6.0 - 8.3 g/dL 7.4 6.5 6.7  ?Total Bilirubin 0.2 - 1.2 mg/dL 0.8 0.8 0.6  ?Alkaline Phos 39 - 117 U/L 78 75 -  ?AST 0 - 37 U/L 22 18 16   ?ALT 0 - 35 U/L 12 13 11   ?  ?Colonoscopy by Dr. Bryan Lemma  02/28/2021  ?- Anal fissure found on perianal exam and on retroflexed views. ?- Mild diverticulosis in the sigmoid colon. ?- Non-bleeding internal hemorrhoids. ?- The examined portion of the ileum was normal. ?- No specimens collected. ?-Repeat colonoscopy 10 years.  ? ?Current Outpatient Medications on File Prior to Visit  ?Medication Sig Dispense Refill  ? AJOVY 225 MG/1.5ML SOSY INJECT 225 MG INTO THE SKIN EVERY 30 (THIRTY) DAYS. 1.5 mL 5  ? b complex vitamins tablet Take 1 tablet by mouth daily.    ? diltiazem 2 % GEL Apply 1 application topically 2 (two) times daily. Apply per rectum twice daily 30 g 2  ? losartan-hydrochlorothiazide (HYZAAR) 50-12.5 MG tablet Take 1 tablet by mouth daily. 90 tablet 1  ? naproxen sodium (ALEVE) 220 MG tablet Take 220 mg by mouth as needed.    ? polyethylene glycol (MIRALAX / GLYCOLAX) 17 g packet Take 17 g by mouth daily as needed.    ? pravastatin (PRAVACHOL) 80 MG tablet TAKE 1 TABLET BY MOUTH EVERY DAY 90 tablet 1  ? SUMAtriptan (IMITREX) 100 MG tablet TAKE 1 TABLET EVERY 2 (TWO) HOURS AS NEEDED FOR MIGRAINE. AS NEEDED FOR MIGRAINES 9 tablet 4  ? Vitamin D, Ergocalciferol, (DRISDOL) 1.25 MG (50000 UNIT) CAPS capsule TAKE 1 CAPSULE (50,000 UNITS TOTAL) BY  MOUTH EVERY 7 (SEVEN) DAYS 12 capsule 0  ? ?No current facility-administered medications on file prior to visit.  ? ?Allergies  ?Allergen Reactions  ? Inderal [Propranolol] Rash  ? Tomato Rash  ? ? Current Medications, Allergies, Past Medical History, Past Surgical History, Family History and Social History were reviewed in [employer] record. ? ?Review of Systems:   ?Constitutional: Negative for fever, sweats, chills or weight loss.  ?Respiratory: Negative for shortness of breath.   ?Cardiovascular: Negative for chest pain, palpitations and leg swelling.  ?Gastrointestinal: See HPI.  ?Musculoskeletal: Negative for back pain or muscle aches.  ?Neurological: Negative for dizziness, headaches or  paresthesias.  ? ? ?Physical Exam: ?BP 114/72   Pulse 74   Ht 5\' 4"  (1.626 m)   Wt 188 lb 4 oz (85.4 kg)   BMI 32.31 kg/m?  ?General: 61 year old female in no acute distress. ?Head: Normocephalic and atraumatic. ?Eyes: No scleral icterus. Conjunctiva pink . ?Ears: Normal auditory acuity. ?Lungs: Clear throughout to auscultation. ?Heart: Regular rate and rhythm, no murmur. ?Abdomen: Soft, nontender and nondistended. No masses or hepatomegaly. Normal bowel sounds x 4 quadrants.  ?Rectal: Deferred. ?Musculoskeletal: Symmetrical with no gross deformities. ?Extremities: No edema. ?Neurological: Alert oriented x 4. No focal deficits.  ?Psychological: Alert and cooperative. Normal mood and affect ? ?Assessment and Recommendations: ? ?98) 61 year old female with an anal fissure which is healed after administering Diltiazem fissure cream 3 times daily for 6 weeks.  Colonoscopy 02/2021 showed an anal fissure, sigmoid diverticulosis and nonbleeding internal hemorrhoids.  No polyps. ?-Avoid constipation ?-MiraLAX as needed ?-Patient to call office if anorectal pain or bleeding recurs ?-Next colonoscopy due 02/2031 ?-Diet as tolerated ? ?

## 2021-04-18 NOTE — Progress Notes (Signed)
Agree with the assessment and plan as outlined by Colleen Kennedy-Smith, NP.   Tasmia Blumer, DO, FACG [facility]   

## 2021-05-08 ENCOUNTER — Telehealth: Payer: Self-pay | Admitting: Physician Assistant

## 2021-05-08 NOTE — Telephone Encounter (Signed)
Spoke with patient advise to go to [facility] for assessment ?Stated was at an appointment with morning and will try to go in the afternoon   ?

## 2021-05-08 NOTE — Telephone Encounter (Signed)
Patient has a puncture. Patient was cutting with a knife and accidentally hurt herself in her finger. Patient has washed it and cleaned it since then. Stated a fatty tissue came out, which makes her think the puncture is a bit deep. She wants to know what should she do or if she should come see you. Finger around is getting a bit puffy per patient but it is not infected.  ? ?Per patient, Should she seek urgent care help? Or come to office. ?

## 2021-05-24 ENCOUNTER — Ambulatory Visit: Payer: BC Managed Care – PPO | Admitting: Family Medicine

## 2021-05-24 ENCOUNTER — Encounter: Payer: Self-pay | Admitting: Family Medicine

## 2021-05-24 VITALS — BP 124/85 | HR 77 | Ht 64.0 in | Wt 186.4 lb

## 2021-05-24 DIAGNOSIS — G43709 Chronic migraine without aura, not intractable, without status migrainosus: Secondary | ICD-10-CM | POA: Diagnosis not present

## 2021-05-24 MED ORDER — AJOVY 225 MG/1.5ML ~~LOC~~ SOSY: 225.0000 mg | PREFILLED_SYRINGE | SUBCUTANEOUS | 3 refills | Status: DC

## 2021-05-24 NOTE — Progress Notes (Signed)
? ? ?Chief Complaint  ?Patient presents with  ? Follow-up  ?  Alison Mahoney 11, alone. Last seen 03/28/20. No new sx. Doing well. Taking Ajovy. Will get migraines that last for a couple weeks and then go away. Will be ok for 6 weeks and then they will come back. Has about 4-5 migraines during 2 weeks she has them.  ?She also reports last time she refilled imitrex, got different generic. Did not work as well.   ? ? ?HISTORY OF PRESENT ILLNESS: ? ?05/24/21 ALL:  ?Alison Mahoney returns for follow up for migraines. She continues Ajovy and sumatriptan. She feels that she is doing well. She does have headaches that seem to occur in cycles. Unsure if this correlates with timing of Ajovy. Overall, she feels headaches are well managed. She has 4-5 migraines on average. Sumatriptan usually works for abortive therapy. Last rx was different manufacturer and did not seem to work as well. She is starting a new job with [employer].  ? ?03/28/2020 ALL:  ?Alison Mahoney is a 61 y.o. female here today for follow up for migraines. She continues Ajovy and sumatriptan. She feels that she is doing well. Baseline was 15-20 migraine days, now 3-5. Headaches are not as severe and do not last as long. She does say that she tends to wake up with migraines. Once she gets up and gets ready, headache resolves. She denies snoring. No daytime sleepiness or dry mouth. She does note that her blood pressure fluctuates. She is on losartan/hctz 50-12.'5mg'$  (reduced from 100/25 in June, 2021). She is watching her diet closely.  ? ?HISTORY (copied from Alison Mahoney previous note) ? ?HPI:  Alison Mahoney is a 62 y.o. female here as requested by Alison Coke, PA for frequent headaches.PMHx migraines, HTN, HD, Ankylosing spondylitis.  I reviewed Alison Mahoney's notes: She has tried multiple preventatives, had "all sorts of work up" at the [employer], has not had good relief. Tried: Topamax, elavil, b-blocker. From a thorough review of records, meds tried include:  flexeril, diclofenac, naproxen, imitrex,inderal,  topamax, b-blocker, elevil. After review of Epic and Care Everywhere I do not see any brain imaging available.  ?  ?She has had migraines since high school, they have not improved in menopause, stable frequency and severity, over years she has had many medications, Hers can last 2-3 days, Imitrex works the best acutely, right side mostly can also be on the left side, around the eye above the eye, they feel hot, throbbing, searing, photophobia the most, phonophobia, nausea, movement makes it worse, sometimes she can;t get out of the bed, a cold cloth helps. They can be moderately severe to severe. At least 15 migraine days a month. No medication overuse. She does not have aura frequently, maybe some word-finding problems. She can wake with headaches, she can have vision changes. But she does not think she has any new qualities, No other focal neurologic deficits, associated symptoms, inciting events or modifiable factors. ?  ?Reviewed notes, labs and imaging from outside physicians, which showed: ?  ?Cbc normal, CMP unremarkable(low platelets) 04/2019 ? ? ?REVIEW OF SYSTEMS: Out of a complete 14 system review of symptoms, the patient complains only of the following symptoms, headaches and all other reviewed systems are negative. ? ? ? ?ALLERGIES: ?Allergies  ?Allergen Reactions  ? Inderal [Propranolol] Rash  ? Tomato Rash  ? ? ? ?HOME MEDICATIONS: ?Outpatient Medications Prior to Visit  ?Medication Sig Dispense Refill  ? AJOVY 225 MG/1.5ML SOSY INJECT 225  MG INTO THE SKIN EVERY 30 (THIRTY) DAYS. 1.5 mL 5  ? b complex vitamins tablet Take 1 tablet by mouth daily.    ? losartan-hydrochlorothiazide (HYZAAR) 50-12.5 MG tablet Take 1 tablet by mouth daily. 90 tablet 1  ? naproxen sodium (ALEVE) 220 MG tablet Take 220 mg by mouth as needed.    ? polyethylene glycol (MIRALAX / GLYCOLAX) 17 g packet Take 17 g by mouth daily as needed.    ? pravastatin (PRAVACHOL) 80 MG tablet  TAKE 1 TABLET BY MOUTH EVERY DAY 90 tablet 1  ? SUMAtriptan (IMITREX) 100 MG tablet TAKE 1 TABLET EVERY 2 (TWO) HOURS AS NEEDED FOR MIGRAINE. AS NEEDED FOR MIGRAINES 9 tablet 4  ? diltiazem 2 % GEL Apply 1 application topically 2 (two) times daily. Apply per rectum twice daily 30 g 2  ? Vitamin D, Ergocalciferol, (DRISDOL) 1.25 MG (50000 UNIT) CAPS capsule TAKE 1 CAPSULE (50,000 UNITS TOTAL) BY MOUTH EVERY 7 (SEVEN) DAYS 12 capsule 0  ? ?No facility-administered medications prior to visit.  ? ? ? ?PAST MEDICAL HISTORY: ?Past Medical History:  ?Diagnosis Date  ? Ankylosing spondylitis ([place])   ? 35 years ago  ? Hyperlipidemia   ? Hypertension   ? Migraines   ? ? ? ?PAST SURGICAL HISTORY: ?Past Surgical History:  ?Procedure Laterality Date  ? ABDOMINAL HYSTERECTOMY  1988  ? COLONOSCOPY    ? lt hand surgery  1985  ? Lt middle toe surgery  1977  ? OOPHORECTOMY Bilateral 08/05/2019  ? also removed tumors from L & R ovary; benign   ? ovarian tumor removal Left 08/05/2019  ? 2 lb, benign; @ [facility]  ? Rt thumb joint replacement  1982  ? SALPINGECTOMY Bilateral 08/05/2019  ? vaginal delivery x3  1979, 1982, 1986  ? ? ? ?FAMILY HISTORY: ?Family History  ?Problem Relation Age of Onset  ? Arthritis Mother   ? Alzheimer's disease Mother   ? Arthritis Father   ? Diabetes Brother   ? Cancer Maternal Grandmother   ? Cancer Paternal Grandmother   ? Cancer Paternal Grandfather   ? Diabetes Paternal Grandfather   ? Healthy Daughter   ? Healthy Son   ? Healthy Son   ? Colon cancer Neg Hx   ? Esophageal cancer Neg Hx   ? Pancreatic cancer Neg Hx   ? Stomach cancer Neg Hx   ? ? ? ?SOCIAL HISTORY: ?Social History  ? ?Socioeconomic History  ? Marital status: Single  ?  Spouse name: Not on file  ? Number of children: 3  ? Years of education: Not on file  ? Highest education level: Not on file  ?Occupational History  ? Not on file  ?Tobacco Use  ? Smoking status: Never  ? Smokeless tobacco: Never  ?Vaping Use  ? Vaping Use: Never used   ?Substance and Sexual Activity  ? Alcohol use: Never  ? Drug use: Never  ? Sexual activity: Not Currently  ?Other Topics Concern  ? Not on file  ?Social History Narrative  ? Moved from [place]  ? Grandchildren in [place]; 3 children  ? 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   ? Lives alone  ? Right handed  ? Caffeine: 1 cup/day, rarely may have a second cup  ? ?Social Determinants of Health  ? ?Financial Resource Strain: Not on file  ?Food Insecurity: Not on file  ?Transportation Needs: Not on file  ?Physical Activity: Not on file  ?  Stress: Not on file  ?Social Connections: Not on file  ?Intimate Partner Violence: Not on file  ? ? ? ? ?PHYSICAL EXAM ? ?Vitals:  ? 05/24/21 1409  ?BP: 124/85  ?Pulse: 77  ?Weight: 186 lb 6.4 oz (84.6 kg)  ?Height: '5\' 4"'$  (1.626 m)  ? ? ?Body mass index is 32 kg/m?. ? ? ?Generalized: Well developed, in no acute distress ? ?Cardiology: normal rate and rhythm, no murmur auscultated  ?Respiratory: clear to auscultation bilaterally   ? ?Neurological examination  ?Mentation: Alert oriented to time, place, history taking. Follows all commands speech and language fluent ?Cranial nerve II-XII: Pupils were equal round reactive to light. Extraocular movements were full, visual field were full on confrontational test. Facial sensation and strength were normal. Head turning and shoulder shrug  were normal and symmetric. ?Motor: The motor testing reveals 5 over 5 strength of all 4 extremities. Good symmetric motor tone is noted throughout.  ?Gait and station: Gait is normal.  ? ? ? ?DIAGNOSTIC DATA (LABS, IMAGING, TESTING) ?- I reviewed patient records, labs, notes, testing and imaging myself where available. ? ?Lab Results  ?Component Value Date  ? WBC 5.8 01/25/2021  ? HGB 14.2 01/25/2021  ? HCT 42.3 01/25/2021  ? MCV 88.8 01/25/2021  ? PLT 216.0 01/25/2021  ? ?   ?Component Value Date/Time  ? NA 138 11/09/2020 0906  ? K 3.4 (L) 11/09/2020 0906  ? CL 102  11/09/2020 0906  ? CO2 26 11/09/2020 0906  ? GLUCOSE 93 11/09/2020 0906  ? BUN 12 11/09/2020 0906  ? CREATININE 0.63 11/09/2020 0906  ? CREATININE 0.73 12/16/2019 0804  ? CALCIUM 9.6 11/09/2020 0906  ? PROT 7.4 11/09/2020

## 2021-05-24 NOTE — Patient Instructions (Signed)
Below is our plan: ? ?We will continue Ajovy and sumatriptan for now. We can consider either Emgality injections or Qulipta orally. I recommend journaling headaches to see if there is any connection with timing of injection and/or triggers. ? ?Please make sure you are staying well hydrated. I recommend 50-60 ounces daily. Well balanced diet and regular exercise encouraged. Consistent sleep schedule with 6-8 hours recommended.  ? ?Please continue follow up with care team as directed.  ? ?Follow up with me in 1 year if doing well, sooner if needed.  ? ?You may receive a survey regarding today's visit. I encourage you to leave honest feed back as I do use this information to improve patient care. Thank you for seeing me today!  ? ? ?

## 2021-06-19 ENCOUNTER — Other Ambulatory Visit: Payer: Self-pay | Admitting: Physician Assistant

## 2021-06-19 DIAGNOSIS — Z1231 Encounter for screening mammogram for malignant neoplasm of breast: Secondary | ICD-10-CM

## 2021-07-03 ENCOUNTER — Ambulatory Visit
Admission: RE | Admit: 2021-07-03 | Discharge: 2021-07-03 | Disposition: A | Discharge status: Home / Self Care | Payer: BC Managed Care – PPO | Source: Ambulatory Visit | Attending: Physician Assistant | Admitting: Physician Assistant

## 2021-07-03 DIAGNOSIS — Z1231 Encounter for screening mammogram for malignant neoplasm of breast: Secondary | ICD-10-CM

## 2021-07-08 ENCOUNTER — Other Ambulatory Visit: Payer: Self-pay | Admitting: Physician Assistant

## 2021-08-03 ENCOUNTER — Ambulatory Visit: Payer: BC Managed Care – PPO | Admitting: Family Medicine

## 2021-09-26 ENCOUNTER — Other Ambulatory Visit: Payer: Self-pay | Admitting: Physician Assistant

## 2021-10-06 ENCOUNTER — Ambulatory Visit: Payer: BC Managed Care – PPO | Admitting: Physician Assistant

## 2021-10-06 ENCOUNTER — Encounter: Payer: Self-pay | Admitting: Physician Assistant

## 2021-10-06 VITALS — BP 120/80 | HR 92 | Temp 98.3°F | Ht 64.0 in | Wt 182.4 lb

## 2021-10-06 DIAGNOSIS — M2550 Pain in unspecified joint: Secondary | ICD-10-CM | POA: Diagnosis not present

## 2021-10-06 DIAGNOSIS — I10 Essential (primary) hypertension: Secondary | ICD-10-CM

## 2021-10-06 MED ORDER — MELOXICAM 15 MG PO TABS: 15.0000 mg | ORAL_TABLET | Freq: Every day | ORAL | 0 refills | Status: DC

## 2021-10-06 NOTE — Patient Instructions (Signed)
It was great to see you!  Recommend mobic 15 mg daily x 2 weeks and then take as needed following this

## 2021-10-06 NOTE — Progress Notes (Signed)
Alison Mahoney is a 61 y.o. female here for an acute visit.  History of Present Illness:   Chief Complaint  Patient presents with   Joint Pain    Pt c/o tick bite 3 weeks ago and now is having joint pain. Has had 1 round of Doxy from Urgent care. She has done COVID tests Negative.    HPI  Joint pain Got bitten by a tick on the back of her R leg about 3 weeks ago. Found it while showering. Went to Urgent Care and they helped remove it and gave her a 2 week course of doxycycline. She took as directed.   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 Joint pain is waking her up at night and keeping her up at night. Taking 2 Aleve in the morning x 1 week. Occasional tylenol. Has had arthritis flares in the past and this feels similarly.  She has hx of ankylosing spondylitis but hasn't had a flare in a very long time for this. At one point she was on a prescription anti-inflammatory for this.  Denies: neck stiffness, fever, chills, muscle aches   HTN Currently taking losartan-hctz 50-12.5 mg. At home blood pressure readings are: WNL. Patient denies chest pain, SOB, blurred vision, dizziness, unusual headaches, lower leg swelling. Patient is compliant with medication. Denies excessive caffeine intake, stimulant usage, excessive alcohol intake, or increase in salt consumption.  BP Readings from Last 3 Encounters:  10/06/21 120/80  05/24/21 124/85  04/14/21 114/72      Past Medical History:  Diagnosis Date   Ankylosing spondylitis ([place])    35 years ago   Hyperlipidemia    Hypertension    Migraines      Social History   Tobacco Use   Smoking status: Never   Smokeless tobacco: Never  Vaping Use   Vaping Use: Never used  Substance Use Topics   Alcohol use: Never   Drug use: Never    Past Surgical History:  Procedure Laterality Date   ABDOMINAL HYSTERECTOMY  1988    COLONOSCOPY     lt hand surgery  1985   Lt middle toe surgery  1977   OOPHORECTOMY Bilateral 08/05/2019   also removed tumors from L & R ovary; benign    ovarian tumor removal Left 08/05/2019   2 lb, benign; @ [facility]   Rt thumb joint replacement  1982   SALPINGECTOMY Bilateral 08/05/2019   vaginal delivery x3  1979, 1982, 1986    Family History  Problem Relation Age of Onset   Arthritis Mother    Alzheimer's disease Mother    Arthritis Father    Diabetes Brother    Cancer Maternal Grandmother    Cancer Paternal Grandmother    Cancer Paternal Grandfather    Diabetes Paternal Merchant [employer] officer    Healthy Daughter    Healthy Son    Healthy Son    Colon cancer Neg Hx    Esophageal cancer Neg Hx    Pancreatic cancer Neg Hx    Stomach cancer Neg Hx     Allergies  Allergen Reactions   Inderal [Propranolol] Rash   Tomato Rash    Current Medications:   Current Outpatient Medications:    b complex vitamins tablet, Take 1 tablet by mouth daily., Disp: , Rfl:    Fremanezumab-vfrm (AJOVY) 225 MG/1.5ML SOSY, Inject 225 mg into the skin every 30 (  thirty) days., Disp: 4.5 mL, Rfl: 3   losartan-hydrochlorothiazide (HYZAAR) 50-12.5 MG tablet, TAKE 1 TABLET BY MOUTH EVERY DAY, Disp: 90 tablet, Rfl: 1   naproxen sodium (ALEVE) 220 MG tablet, Take 220 mg by mouth as needed., Disp: , Rfl:    polyethylene glycol (MIRALAX / GLYCOLAX) 17 g packet, Take 17 g by mouth daily as needed., Disp: , Rfl:    pravastatin (PRAVACHOL) 80 MG tablet, TAKE 1 TABLET BY MOUTH EVERY DAY, Disp: 90 tablet, Rfl: 1   SUMAtriptan (IMITREX) 100 MG tablet, TAKE 1 TABLET EVERY 2 (TWO) HOURS AS NEEDED FOR MIGRAINE. AS NEEDED FOR MIGRAINES, Disp: 9 tablet, Rfl: 1   Review of Systems:   ROS Negative unless otherwise specified per HPI.   Vitals:   Vitals:   10/06/21 1558  BP: 120/80  Pulse: 92  Temp: 98.3 F (36.8 C)  TempSrc: Temporal  SpO2: 98%  Weight: 182 lb 6.1 oz (82.7 kg)  Height: '5\' 4"'$  (1.626 m)     Body  mass index is 31.31 kg/m.  Physical Exam:   Physical Exam Vitals and nursing note reviewed.  Constitutional:      General: She is not in acute distress.    Appearance: She is well-developed. She is not ill-appearing or toxic-appearing.  Cardiovascular:     Rate and Rhythm: Normal rate and regular rhythm.     Pulses: Normal pulses.     Heart sounds: Normal heart sounds, S1 normal and S2 normal.  Pulmonary:     Effort: Pulmonary effort is normal.     Breath sounds: Normal breath sounds.  Musculoskeletal:     Comments: No spinal tenderness appreciated No significant clicking/popping or malalignment of b/l TMJ No obvious joint swelling or erythema  Skin:    General: Skin is warm and dry.  Neurological:     Mental Status: She is alert.     GCS: GCS eye subscore is 4. GCS verbal subscore is 5. GCS motor subscore is 6.  Psychiatric:        Speech: Speech normal.        Behavior: Behavior normal. Behavior is cooperative.     Assessment and Plan:   Arthralgia, unspecified joint Suspect likely OA and her ankylosing spondylitis Recommend mobic 15 mg daily x 2 weeks and then take as needed following this Update basic blood work today  Follow-up if new/worsening sx Doubt Lyme Disease since she was treated appropriately with doxycycline  Essential hypertension Well controlled Continue losartan-hctz 50-12.5 mg Follow-up in 6 mo, sooner if concerns  [name], PA-C

## 2021-10-07 LAB — COMPREHENSIVE METABOLIC PANEL
AG Ratio: 1.7 (calc) (ref 1.0–2.5)
ALT: 9 U/L (ref 6–29)
AST: 20 U/L (ref 10–35)
Albumin: 4.6 g/dL (ref 3.6–5.1)
Alkaline phosphatase (APISO): 91 U/L (ref 37–153)
BUN: 13 mg/dL (ref 7–25)
CO2: 19 mmol/L — ABNORMAL LOW (ref 20–32)
Calcium: 9.5 mg/dL (ref 8.6–10.4)
Chloride: 106 mmol/L (ref 98–110)
Creat: 0.68 mg/dL (ref 0.50–1.05)
Globulin: 2.7 g/dL (calc) (ref 1.9–3.7)
Glucose, Bld: 101 mg/dL — ABNORMAL HIGH (ref 65–99)
Potassium: 4.3 mmol/L (ref 3.5–5.3)
Sodium: 144 mmol/L (ref 135–146)
Total Bilirubin: 0.6 mg/dL (ref 0.2–1.2)
Total Protein: 7.3 g/dL (ref 6.1–8.1)

## 2021-10-07 LAB — CBC WITH DIFFERENTIAL/PLATELET
Absolute Monocytes: 511 cells/uL (ref 200–950)
Basophils Absolute: 64 cells/uL (ref 0–200)
Basophils Relative: 0.9 %
Eosinophils Absolute: 149 cells/uL (ref 15–500)
Eosinophils Relative: 2.1 %
HCT: 44.2 % (ref 35.0–45.0)
Hemoglobin: 15 g/dL (ref 11.7–15.5)
Lymphs Abs: 1782 cells/uL (ref 850–3900)
MCH: 29.8 pg (ref 27.0–33.0)
MCHC: 33.9 g/dL (ref 32.0–36.0)
MCV: 87.7 fL (ref 80.0–100.0)
MPV: 13.4 fL — ABNORMAL HIGH (ref 7.5–12.5)
Monocytes Relative: 7.2 %
Neutro Abs: 4594 cells/uL (ref 1500–7800)
Neutrophils Relative %: 64.7 %
Platelets: 199 10*3/uL (ref 140–400)
RBC: 5.04 10*6/uL (ref 3.80–5.10)
RDW: 13.3 % (ref 11.0–15.0)
Total Lymphocyte: 25.1 %
WBC: 7.1 10*3/uL (ref 3.8–10.8)

## 2021-10-07 LAB — C-REACTIVE PROTEIN: CRP: 1.9 mg/L (ref <8.0)

## 2021-10-07 LAB — SEDIMENTATION RATE: Sed Rate: 6 mm/h (ref 0–30)

## 2021-10-09 ENCOUNTER — Telehealth: Payer: Self-pay | Admitting: Family Medicine

## 2021-10-09 IMAGING — MR MR PELVIS WO/W CM
9 of 13 series · 30 of 48 positions shown · IV contrast (9ml Gadavist)
Comparison: Pelvic ultrasound, 07/20/2019

CLINICAL DATA: Adnexal mass

EXAM:
MRI PELVIS WITHOUT AND WITH CONTRAST
TECHNIQUE: Multiplanar multisequence MR imaging of the pelvis was performed
both before and after administration of intravenous contrast.
CONTRAST:  9mL GADAVIST GADOBUTROL 1 MMOL/ML IV SOLN

[Series 3: T2 · axial · 5.0mm · 0.75mm/px · z∈[-82,+134]mm · 2 of 37 slices shown (1 of 4)]
[im 1/37]
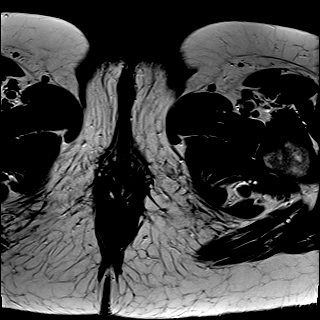
[im 37/37]
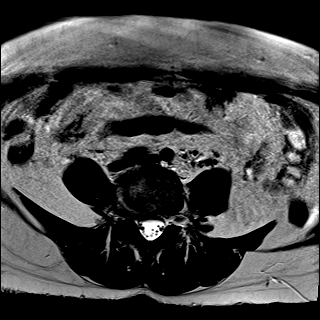

[Series 4: T2 fat-sat · axial · 5.0mm · 0.90mm/px · z∈[-82,+134]mm · 3 of 37 slices shown]
[im 1/37]
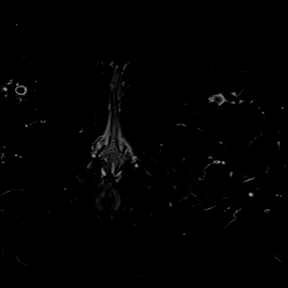
[im 19/37]
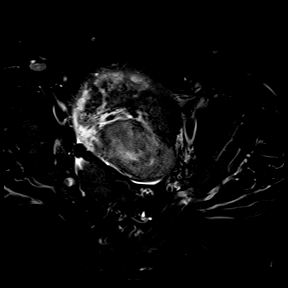
[im 37/37]
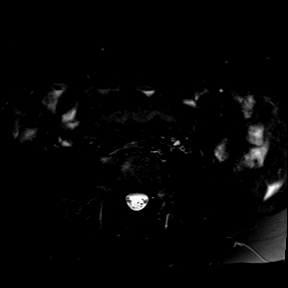

[Series 5: T2 · coronal · 5.0mm · 1.41mm/px · 2 of 32 slices shown (2 of 4)]
[im 1/32]
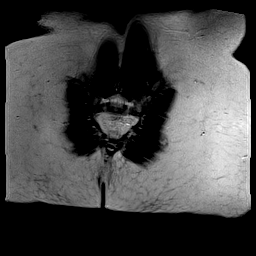
[im 32/32]
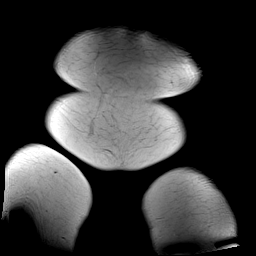

[Series 6: sag tse · sagittal · 5.0mm · 0.75mm/px · 3 of 33 slices shown]
[im 1/33]
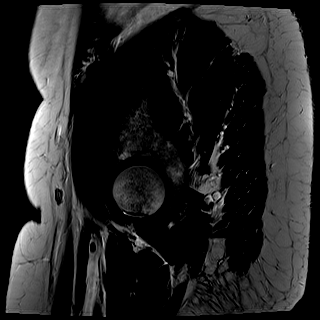
[im 17/33]
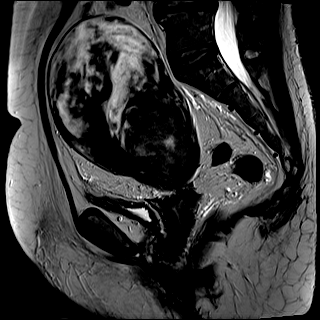
[im 33/33]
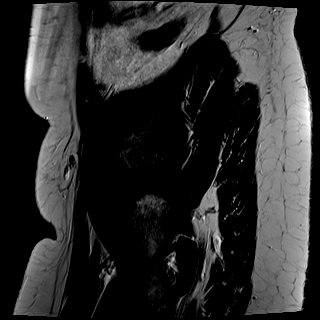

[Series 7: ax in&out whole · axial · 5.0mm · 0.74mm/px · z∈[-71,+133]mm · 3 of 35 slices shown]
[im 1/35]
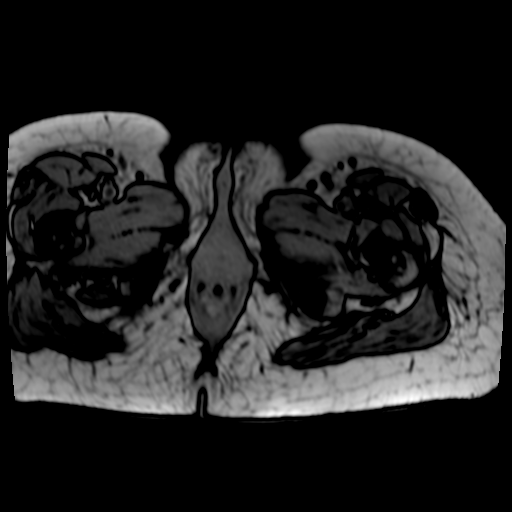
[im 18/35]
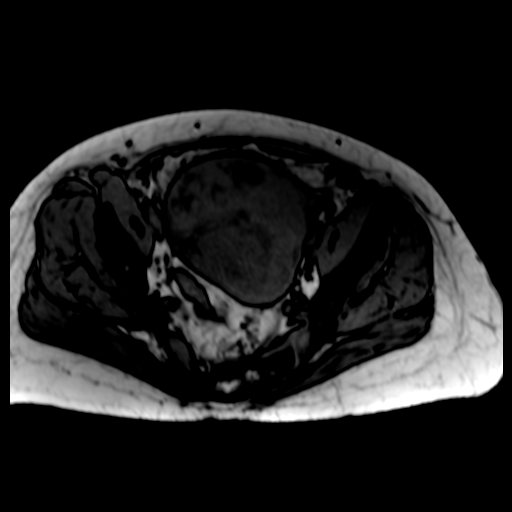
[im 35/35]
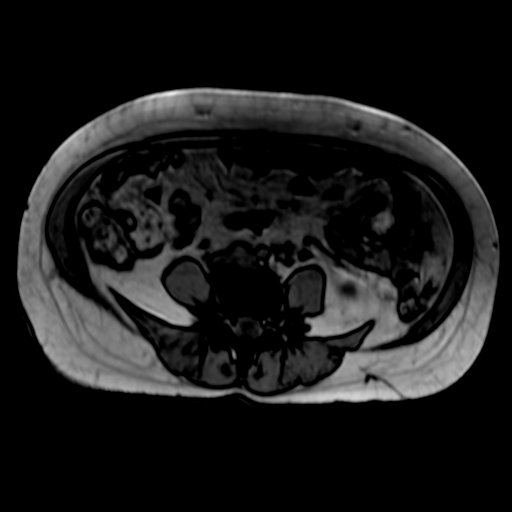

[Series 8: T2 · axial · 5.0mm · 0.75mm/px · z∈[-116,+123]mm · 3 of 41 slices shown (3 of 4)]
[im 1/41]
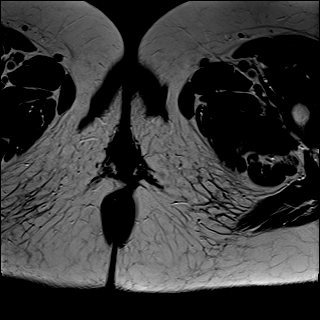
[im 21/41]
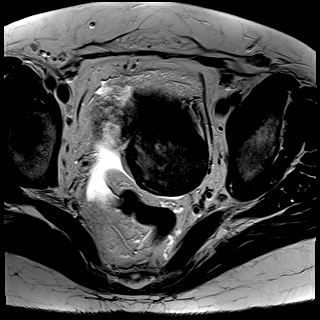
[im 41/41]
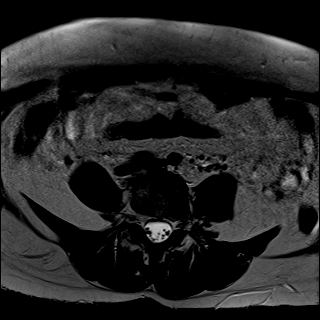

[Series 9: T2 · coronal · 5.0mm · 0.75mm/px · 3 of 35 slices shown (4 of 4)]
[im 1/35]
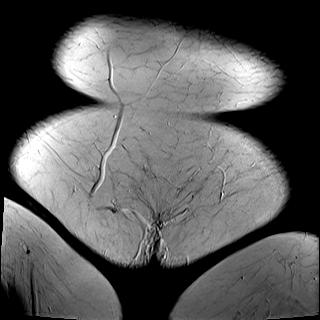
[im 18/35]
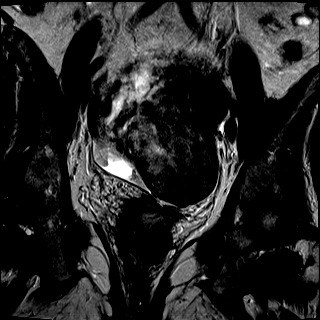
[im 35/35]
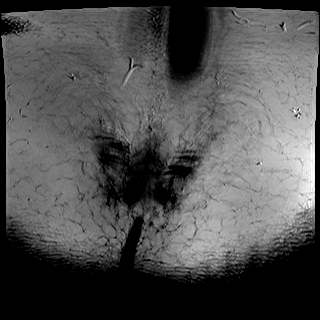

[Series 11: T1 dynamic fat-sat · axial · 3.0mm · 0.47mm/px · z∈[-105,+132]mm · 6 of 80 slices shown (1 of 2)]
[im 1/80]
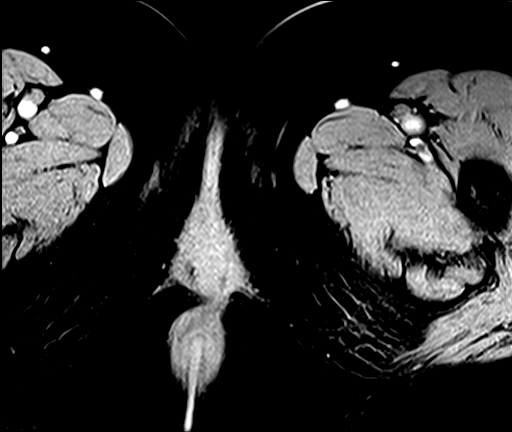
[im 16/80]
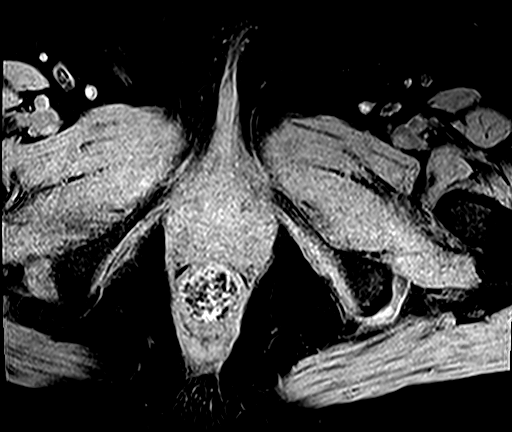
[im 32/80]
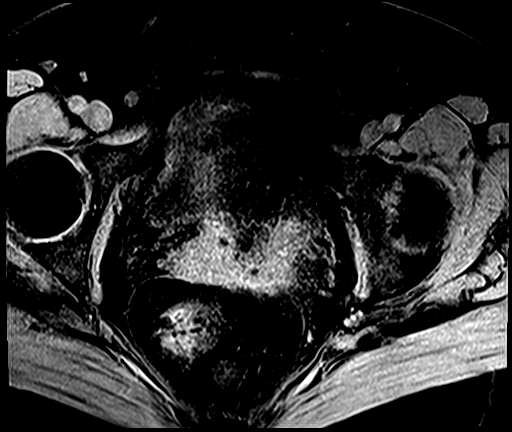
[im 48/80]
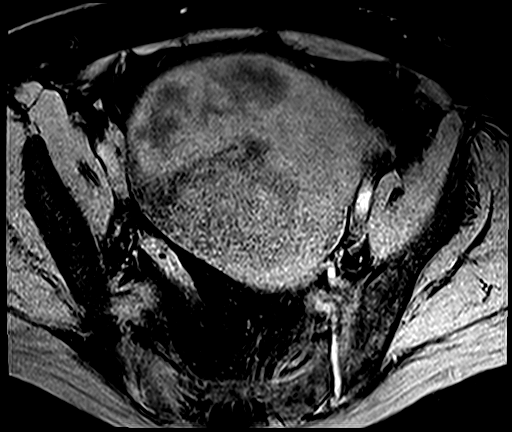
[im 64/80]
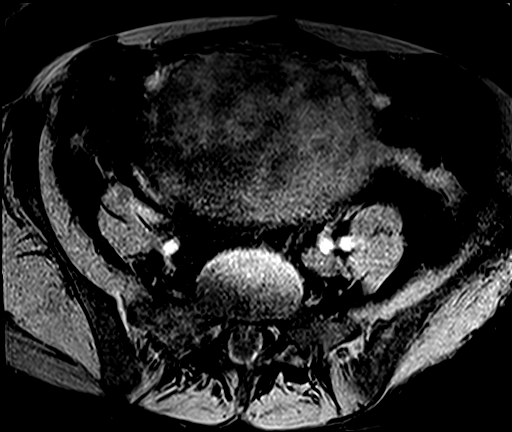
[im 80/80]
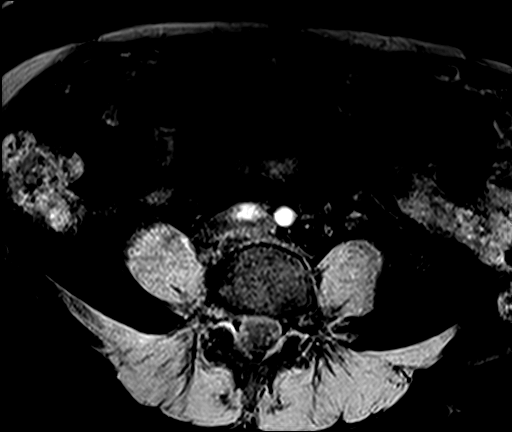

[Series 12: T1 dynamic fat-sat · axial · 3.0mm · 0.47mm/px · z∈[-105,+84]mm · 5 of 80 slices shown (2 of 2)]
[im 1/80]
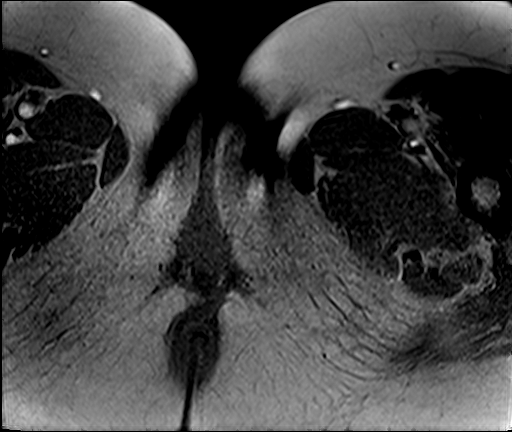
[im 16/80]
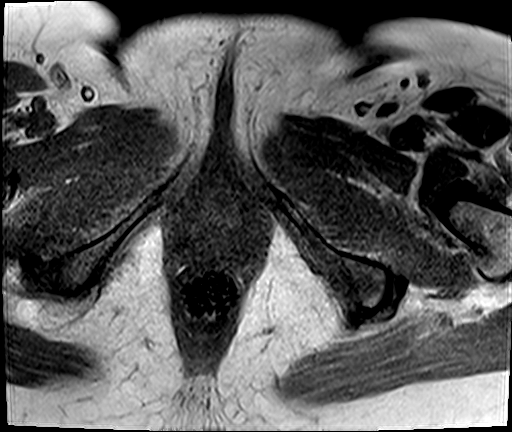
[im 32/80]
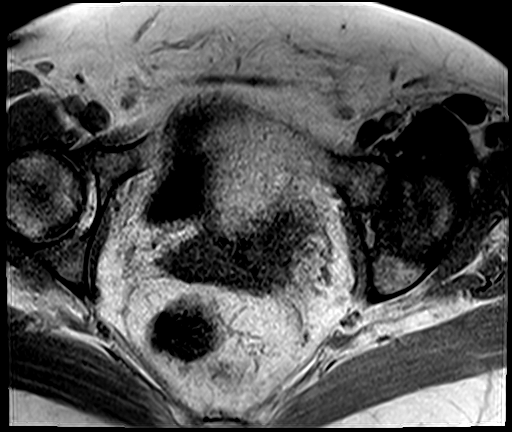
[im 48/80]
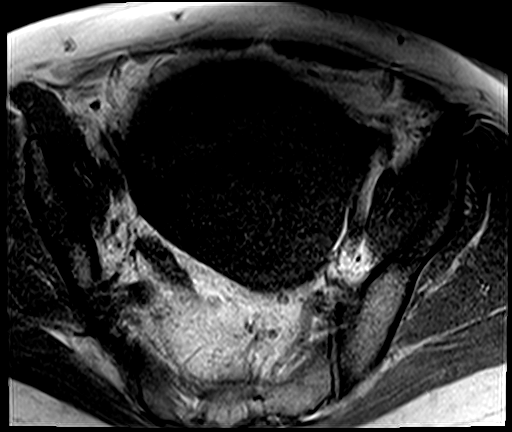
[im 64/80]
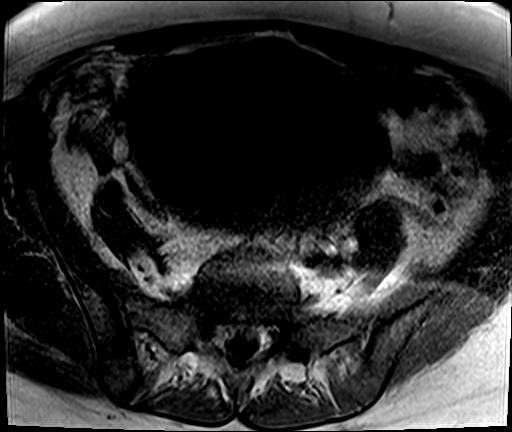

[30 of 48 positions shown; findings below may reference images not displayed]

FINDINGS: Urinary Tract:  No abnormality visualized.

Bowel:  Unremarkable visualized pelvic bowel loops.

Vascular/Lymphatic: No pathologically enlarged lymph nodes. No
significant vascular abnormality seen.

Reproductive: There is a large, mixed solid and cystic mass with
heterogeneous contrast enhancement arising from the left ovary,
measuring 14.9 x 12.9 x 10.5 cm (series 3, image 13, series 6, image
19). The right ovary is normal in size and likely contains a
subcentimeter cyst or follicle, difficult to characterize due to
size. Status post hysterectomy.

Other:  Trace fluid in the pelvis.

Musculoskeletal: No suspicious bone lesions identified.
IMPRESSION: 1. There is a large, mixed solid and cystic mass with heterogeneous
contrast enhancement arising from the left ovary and measuring
cm, most consistent with ovarian malignancy.

2.  Status post hysterectomy.

3.  Trace, nonspecific fluid in the pelvis.

4. No lymphadenopathy or other direct evidence of metastatic disease
in the pelvis.

These results will be called to the ordering clinician or
representative by the Radiologist Assistant, and communication
documented in the PACS or [REDACTED].

## 2021-10-09 NOTE — Telephone Encounter (Signed)
Noted  
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
## 2021-10-10 ENCOUNTER — Other Ambulatory Visit: Payer: Self-pay | Admitting: Neurology

## 2021-10-10 DIAGNOSIS — G43709 Chronic migraine without aura, not intractable, without status migrainosus: Secondary | ICD-10-CM

## 2021-10-10 MED ORDER — AJOVY 225 MG/1.5ML ~~LOC~~ SOSY: 225.0000 mg | PREFILLED_SYRINGE | SUBCUTANEOUS | 2 refills | Status: DC

## 2021-10-25 ENCOUNTER — Other Ambulatory Visit: Payer: Self-pay | Admitting: Neurology

## 2021-10-25 ENCOUNTER — Telehealth: Payer: Self-pay | Admitting: Family Medicine

## 2021-10-25 DIAGNOSIS — G43709 Chronic migraine without aura, not intractable, without status migrainosus: Secondary | ICD-10-CM

## 2021-10-25 MED ORDER — AJOVY 225 MG/1.5ML ~~LOC~~ SOSY: 225.0000 mg | PREFILLED_SYRINGE | SUBCUTANEOUS | 2 refills | Status: DC

## 2021-10-25 NOTE — Telephone Encounter (Signed)
Refill sent for the patient to the local pharmacy. Previously was sent to mail order pharmacy

## 2021-10-25 NOTE — Telephone Encounter (Signed)
Refill sent to the local pharmacy for the pt. Looks like previously was sent to mail order pharmacy
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
## 2021-10-27 ENCOUNTER — Other Ambulatory Visit: Payer: Self-pay | Admitting: Physician Assistant

## 2021-11-02 ENCOUNTER — Other Ambulatory Visit: Payer: Self-pay | Admitting: Physician Assistant

## 2021-11-22 ENCOUNTER — Other Ambulatory Visit: Payer: Self-pay | Admitting: Physician Assistant

## 2021-12-05 ENCOUNTER — Other Ambulatory Visit: Payer: Self-pay | Admitting: Physician Assistant

## 2022-01-01 ENCOUNTER — Other Ambulatory Visit: Payer: Self-pay | Admitting: Physician Assistant

## 2022-01-02 ENCOUNTER — Telehealth: Payer: Self-pay | Admitting: Family Medicine

## 2022-01-02 NOTE — Telephone Encounter (Signed)
Called pt. She reports she may have had reaction from [place]. Injects in stomach in same spot each month. Last injection yesterday afternoon. No issues post injection. Placed band aid. Got up this morning and saw there is some swelling/redness. Swollen area about the size of a silver dollar. Has some heat at site as well. No pain/itchiness associated. I advised pt to continue to monitor. If it worsens, asked her to call to let us know. I recommended she rotate injection sites each month moving forward. Her next f/u is 05/28/22.  Aware I will send to AL,NP for review. If she has anything more to add, I will call her back.

## 2022-01-02 NOTE — Telephone Encounter (Signed)
Pt called the answering service. Stated she is having a allergic reaction to some medication. Pt is requesting call-back from nurse.

## 2022-01-03 ENCOUNTER — Other Ambulatory Visit: Payer: Self-pay | Admitting: *Deleted

## 2022-01-03 MED ORDER — LOSARTAN POTASSIUM-HCTZ 50-12.5 MG PO TABS: 1.0000 | ORAL_TABLET | Freq: Every day | ORAL | 1 refills | Status: DC

## 2022-01-15 ENCOUNTER — Other Ambulatory Visit: Payer: Self-pay | Admitting: Physician Assistant

## 2022-01-22 ENCOUNTER — Telehealth: Payer: Self-pay | Admitting: Gastroenterology

## 2022-01-22 NOTE — Telephone Encounter (Signed)
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  Please place refill for diltiazem 2% applied directly to the fissure tid for 6 weeks.  Refill 1.

## 2022-01-22 NOTE — Telephone Encounter (Signed)
Inbound call from patient requesting refills for diltiazem 2% ointment please.  Please advise.

## 2022-01-23 MED ORDER — AMBULATORY NON FORMULARY MEDICATION: 1 refills | Status: DC

## 2022-01-23 NOTE — Telephone Encounter (Signed)
Diltiazem 2% faxed to the [facility]. Left VM to the patient.

## 2022-02-03 ENCOUNTER — Other Ambulatory Visit: Payer: Self-pay | Admitting: Physician Assistant

## 2022-03-09 ENCOUNTER — Other Ambulatory Visit: Payer: Self-pay | Admitting: Physician Assistant

## 2022-03-17 ENCOUNTER — Other Ambulatory Visit: Payer: Self-pay | Admitting: Physician Assistant

## 2022-04-27 ENCOUNTER — Telehealth: Payer: Self-pay | Admitting: Family Medicine

## 2022-04-27 ENCOUNTER — Other Ambulatory Visit (HOSPITAL_COMMUNITY): Payer: Self-pay

## 2022-04-27 NOTE — Telephone Encounter (Signed)
Pharmacy Patient Advocate Encounter   Received notification from [facility] that prior authorization for AJOVY (fremanezumab-vfrm) injection 225MG /1.5ML syringes is required/requested.   PA submitted on 04/27/2022 to (ins) [employer] via [employer] or [facility]) confirmation # [id number] Status is pending

## 2022-04-27 NOTE — Telephone Encounter (Signed)
Pt requesting PA for Ajovy, thank you

## 2022-04-27 NOTE — Telephone Encounter (Signed)
Pt said pharmacy is requesting a PA for Fremanezumab-vfrm (AJOVY) 225 MG/1.5ML SOSY.

## 2022-04-30 NOTE — Telephone Encounter (Signed)
Called pt and informed her. Pt verbalized appreciation.

## 2022-04-30 NOTE — Telephone Encounter (Signed)
Please call pt and let her know prior auth for Ajovy approved 04/27/2022 - 04/27/2023. PA# Eugene [id number] Non-Grandfathered [id number]  She should now be able to pick this up from the pharmacy.

## 2022-05-07 ENCOUNTER — Other Ambulatory Visit: Payer: Self-pay | Admitting: Physician Assistant

## 2022-05-21 ENCOUNTER — Other Ambulatory Visit: Payer: Self-pay | Admitting: Physician Assistant

## 2022-05-21 DIAGNOSIS — Z1231 Encounter for screening mammogram for malignant neoplasm of breast: Secondary | ICD-10-CM

## 2022-05-28 ENCOUNTER — Encounter: Payer: Self-pay | Admitting: Family Medicine

## 2022-05-28 ENCOUNTER — Ambulatory Visit: Payer: BC Managed Care – PPO | Admitting: Family Medicine

## 2022-05-28 DIAGNOSIS — G43709 Chronic migraine without aura, not intractable, without status migrainosus: Secondary | ICD-10-CM | POA: Diagnosis not present

## 2022-05-28 MED ORDER — AJOVY 225 MG/1.5ML ~~LOC~~ SOSY: 225.0000 mg | PREFILLED_SYRINGE | SUBCUTANEOUS | 3 refills | Status: DC

## 2022-05-28 MED ORDER — SUMATRIPTAN SUCCINATE 100 MG PO TABS: ORAL_TABLET | ORAL | 11 refills | Status: DC

## 2022-05-28 NOTE — Patient Instructions (Signed)
Below is our plan:  We will continue Ajovy and sumatriptan. Let me know if you need me.   Please make sure you are staying well hydrated. I recommend 50-60 ounces daily. Well balanced diet and regular exercise encouraged. Consistent sleep schedule with 6-8 hours recommended.   Please continue follow up with care team as directed.   Follow up with me in 1 year   You may receive a survey regarding today's visit. I encourage you to leave honest feed back as I do use this information to improve patient care. Thank you for seeing me today!

## 2022-05-28 NOTE — Progress Notes (Signed)
Chief Complaint  Patient presents with   Room 1    Pt is here Alone.Pt states that things haven't been terrible since last appointment. Pt states that she can go 1 week without getting a migraine. Pt states that she is doing a lot better.     HISTORY OF PRESENT ILLNESS:  05/28/22 ALL:  Alison Mahoney returns for follow up for migraines. She continues Ajovy and sumatriptan. She feels that she is well managed. She may have 3-4 migraines per month. Most are mild to mod. Sumatriptan works well. She did have a local skin reaction to Ajovy last year but has not happened again. She has noticed a feeling of being slightly off balance for the past few months. She admits to needing to drink more water. She reports BP is usually good at home. She continues losartan/hctz daily. She is followed regularly by PCP.   05/24/2021 ALL: Alison Mahoney returns for follow up for migraines. She continues Ajovy and sumatriptan. She feels that she is doing well. She does have headaches that seem to occur in cycles. Unsure if this correlates with timing of Ajovy. Overall, she feels headaches are well managed. She has 4-5 migraines on average. Sumatriptan usually works for abortive therapy. Last rx was different manufacturer and did not seem to work as well. She is starting a new job with [employer].   03/28/2020 ALL:  Alison Mahoney is a 62 y.o. female here today for follow up for migraines. She continues Ajovy and sumatriptan. She feels that she is doing well. Baseline was 15-20 migraine days, now 3-5. Headaches are not as severe and do not last as long. She does say that she tends to wake up with migraines. Once she gets up and gets ready, headache resolves. She denies snoring. No daytime sleepiness or dry mouth. She does note that her blood pressure fluctuates. She is on losartan/hctz 50-12.5mg  (reduced from 100/25 in June, 2021). She is watching her diet closely.   HISTORY (copied from Dr Trevor Mace previous note)  HPI:  Alison Mahoney is a 62 y.o.  female here as requested by [name], PA for frequent headaches.PMHx migraines, HTN, HD, Ankylosing spondylitis.  I reviewed Alison Mahoney's notes: She has tried multiple preventatives, had "all sorts of work up" at the [employer], has not had good relief. Tried: Topamax, elavil, b-blocker. From a thorough review of records, meds tried include: flexeril, diclofenac, naproxen, imitrex,inderal,  topamax, b-blocker, elevil. After review of Epic and Care Everywhere I do not see any brain imaging available.    She has had migraines since high school, they have not improved in menopause, stable frequency and severity, over years she has had many medications, Alison Mahoney can last 2-3 days, Imitrex works the best acutely, right side mostly can also be on the left side, around the eye above the eye, they feel hot, throbbing, searing, photophobia the most, phonophobia, nausea, movement makes it worse, sometimes she can;t get out of the bed, a cold cloth helps. They can be moderately severe to severe. At least 15 migraine days a month. No medication overuse. She does not have aura frequently, maybe some word-finding problems. She can wake with headaches, she can have vision changes. But she does not think she has any new qualities, No other focal neurologic deficits, associated symptoms, inciting events or modifiable factors.   Reviewed notes, labs and imaging from outside physicians, which showed:   Cbc normal, CMP unremarkable(low platelets) 04/2019   REVIEW OF SYSTEMS: Out  of a complete 14 system review of symptoms, the patient complains only of the following symptoms, headaches and all other reviewed systems are negative.    ALLERGIES: Allergies  Allergen Reactions   Inderal [Propranolol] Rash   Tomato Rash     HOME MEDICATIONS: Outpatient Medications Prior to Visit  Medication Sig Dispense Refill   AMBULATORY NON FORMULARY MEDICATION Medication Name: Diltiazem 2%. Apply pea sized  amount to the fissure directly three times daily for 6 weeks. 30 g 1   b complex vitamins tablet Take 1 tablet by mouth daily.     losartan-hydrochlorothiazide (HYZAAR) 50-12.5 MG tablet Take 1 tablet by mouth daily. 90 tablet 1   meloxicam (MOBIC) 15 MG tablet TAKE 1 TABLET (15 MG TOTAL) BY MOUTH DAILY. 30 tablet 0   polyethylene glycol (MIRALAX / GLYCOLAX) 17 g packet Take 17 g by mouth daily as needed.     pravastatin (PRAVACHOL) 80 MG tablet TAKE 1 TABLET BY MOUTH EVERY DAY 90 tablet 1   Fremanezumab-vfrm (AJOVY) 225 MG/1.5ML SOSY Inject 225 mg into the skin every 30 (thirty) days. 4.5 mL 2   SUMAtriptan (IMITREX) 100 MG tablet TAKE 1 TABLET EVERY 2 (TWO) HOURS AS NEEDED FOR MIGRAINE. AS NEEDED FOR MIGRAINES 9 tablet 1   naproxen sodium (ALEVE) 220 MG tablet Take 220 mg by mouth as needed. (Patient not taking: Reported on 05/28/2022)     No facility-administered medications prior to visit.     PAST MEDICAL HISTORY: Past Medical History:  Diagnosis Date   Ankylosing spondylitis    35 years ago   Hyperlipidemia    Hypertension    Migraines      PAST SURGICAL HISTORY: Past Surgical History:  Procedure Laterality Date   ABDOMINAL HYSTERECTOMY  1988   COLONOSCOPY     lt hand surgery  1985   Lt middle toe surgery  1977   OOPHORECTOMY Bilateral 08/05/2019   also removed tumors from L & R ovary; benign    ovarian tumor removal Left 08/05/2019   2 lb, benign; @ [facility]   Rt thumb joint replacement  1982   SALPINGECTOMY Bilateral 08/05/2019   vaginal delivery x3  1979, 1982, 1986     FAMILY HISTORY: Family History  Problem Relation Age of Onset   Arthritis Mother    Alzheimer's disease Mother    Arthritis Father    Diabetes Brother    Cancer Maternal Grandmother    Cancer Paternal Grandmother    Cancer Paternal Grandfather    Diabetes Paternal Actor    Healthy Daughter    Healthy Son    Healthy Son    Colon cancer Neg Hx    Esophageal cancer Neg Hx    Pancreatic  cancer Neg Hx    Stomach cancer Neg Hx      SOCIAL HISTORY: Social History   Socioeconomic History   Marital status: Single    Spouse name: Not on file   Number of children: 3   Years of education: Not on file   Highest education level: Not on file  Occupational History   Not on file  Tobacco Use   Smoking status: Never   Smokeless tobacco: Never  Vaping Use   Vaping Use: Never used  Substance and Sexual Activity   Alcohol use: Never   Drug use: Never   Sexual activity: Not Currently  Other Topics Concern   Not on file  Social History Narrative   Moved from [place] in [place]; 3  children   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   Generalized: Well developed, in no acute distress  Cardiology: normal rate and rhythm, no murmur auscultated  Respiratory: clear to auscultation bilaterally    Neurological examination  Mentation: Alert oriented to time, place, history taking. Follows all commands speech and language fluent Cranial nerve II-XII: Pupils were equal round reactive to light. Extraocular movements were full, visual field were full on confrontational test. Facial sensation and strength were normal. Head turning and shoulder shrug  were normal and symmetric. Motor: The motor testing reveals 5 over 5 strength of all 4 extremities. Good symmetric motor tone is noted throughout.  Gait and station:  Gait is normal.     DIAGNOSTIC DATA (LABS, IMAGING, TESTING) - I reviewed patient records, labs, notes, testing and imaging myself where available.  Lab Results  Component Value Date   WBC 7.1 10/06/2021   HGB 15.0 10/06/2021   HCT 44.2 10/06/2021   MCV 87.7 10/06/2021   PLT 199 10/06/2021      Component Value Date/Time   NA 144 10/06/2021 1628   K 4.3 10/06/2021 1628   CL 106 10/06/2021 1628   CO2 19 (L) 10/06/2021 1628   GLUCOSE 101 (H) 10/06/2021 1628   BUN 13 10/06/2021 1628   CREATININE 0.68 10/06/2021 1628   CALCIUM 9.5 10/06/2021 1628   PROT 7.3 10/06/2021 1628   ALBUMIN 4.6 11/09/2020 0906   AST 20 10/06/2021 1628   ALT 9 10/06/2021 1628   ALKPHOS 78 11/09/2020 0906   BILITOT 0.6 10/06/2021 1628   GFRNONAA 95 04/29/2019 1412   GFRAA 110 04/29/2019 1412   Lab Results  Component Value Date   CHOL 194 11/09/2020   HDL 54.20 11/09/2020   LDLCALC 116 (H) 11/09/2020   TRIG 120.0 11/09/2020   CHOLHDL 4 11/09/2020   No results found for: "HGBA1C" No results found for: "VITAMINB12" No results found for: "TSH"      No data to display               No data to display           ASSESSMENT AND PLAN  62 y.o. year old female  has a past medical history of Ankylosing spondylitis, Hyperlipidemia, Hypertension, and Migraines. here with   Chronic migraine without aura without status migrainosus, not intractable - Plan: Fremanezumab-vfrm (AJOVY) 225 MG/1.5ML SOSY   Diasia is doing well, today. She reports improvement in severity and frequency of migraines on Ajovy and sumatriptan. We will continue current treatment plan. She has noted feeling slightly off balanced at times. I have asked her to monitor if this may be correlated to timing of Ajovy. Could consider switching to [employer]	[place] if needed. Healthy lifestyle habits and regular follow up with PCP advised. She will follow  up with me in 1 year, sooner if needed.    No orders of the defined  types were placed in this encounter.    Meds ordered this encounter  Medications   Fremanezumab-vfrm (AJOVY) 225 MG/1.5ML SOSY    Sig: Inject 225 mg into the skin every 30 (thirty) days.    Dispense:  4.5 mL    Refill:  3    DX Code G43.709    Order Specific Question:   Supervising Provider    Answer:   Anson Fret [[id number]]   SUMAtriptan (IMITREX) 100 MG tablet    Sig: TAKE 1 TABLET EVERY 2 (TWO) HOURS AS NEEDED FOR MIGRAINE. AS NEEDED FOR MIGRAINES    Dispense:  9 tablet    Refill:  11    Order Specific Question:   Supervising Provider    Answer:   Anson Fret [[id number]]      Shawnie Dapper, MSN, FNP-C 05/28/2022, 10:24 AM  [facility] [address] [place], [place] [address] [phone]

## 2022-06-11 ENCOUNTER — Other Ambulatory Visit: Payer: Self-pay | Admitting: Physician Assistant

## 2022-07-16 ENCOUNTER — Ambulatory Visit
Admission: RE | Admit: 2022-07-16 | Discharge: 2022-07-16 | Disposition: A | Discharge status: Home / Self Care | Payer: BC Managed Care – PPO | Source: Ambulatory Visit | Attending: Physician Assistant | Admitting: Physician Assistant

## 2022-07-16 DIAGNOSIS — Z1231 Encounter for screening mammogram for malignant neoplasm of breast: Secondary | ICD-10-CM

## 2022-07-23 ENCOUNTER — Other Ambulatory Visit: Payer: Self-pay | Admitting: Physician Assistant

## 2022-08-21 ENCOUNTER — Other Ambulatory Visit: Payer: Self-pay | Admitting: Physician Assistant

## 2022-09-09 ENCOUNTER — Other Ambulatory Visit: Payer: Self-pay | Admitting: Physician Assistant

## 2022-10-03 ENCOUNTER — Other Ambulatory Visit: Payer: Self-pay

## 2022-10-10 ENCOUNTER — Other Ambulatory Visit: Payer: Self-pay

## 2022-10-10 DIAGNOSIS — G43709 Chronic migraine without aura, not intractable, without status migrainosus: Secondary | ICD-10-CM

## 2022-10-16 ENCOUNTER — Telehealth: Payer: Self-pay | Admitting: Physician Assistant

## 2022-10-16 DIAGNOSIS — Z1211 Encounter for screening for malignant neoplasm of colon: Secondary | ICD-10-CM

## 2022-10-16 NOTE — Telephone Encounter (Signed)
Spoke to pt told her order for Cologuard has been placed and [facility] will contact you. Pt verbalized understanding.

## 2022-10-16 NOTE — Telephone Encounter (Signed)
Patient called requesting cologuard be mailed to her home for completion since she's been getting reminders to have this done. Patient is scheduled for medication follow up on 9/25 but wanted cologuard done prior if possible. Can this be placed/mailed?

## 2022-10-17 ENCOUNTER — Ambulatory Visit: Payer: BC Managed Care – PPO | Admitting: Physician Assistant

## 2022-10-17 ENCOUNTER — Other Ambulatory Visit: Payer: Self-pay

## 2022-10-17 ENCOUNTER — Telehealth: Payer: Self-pay | Admitting: Family Medicine

## 2022-10-17 DIAGNOSIS — G43709 Chronic migraine without aura, not intractable, without status migrainosus: Secondary | ICD-10-CM

## 2022-10-17 MED ORDER — AJOVY 225 MG/1.5ML ~~LOC~~ SOSY
225.0000 mg | PREFILLED_SYRINGE | SUBCUTANEOUS | 3 refills | Status: DC
Start: 2022-10-17 — End: 2022-11-06

## 2022-10-17 NOTE — Telephone Encounter (Signed)
I called pt and she will contact local pharmacy about prescription ofr ajovy and cost of 3 month supply.  She is to let us know by mychart if she will stay local or need mail order.  She appreciated callback.

## 2022-10-17 NOTE — Telephone Encounter (Signed)
LMVM for pt to return call.  Checking on her request for specialty pharmacy.  Did she call them to see if they would do?  Usually done local or mail order.  Awaiting her call back.

## 2022-10-17 NOTE — Telephone Encounter (Signed)
Pt requesting refill for Fremanezumab-vfrm (AJOVY) 225 MG/1.5ML SOSY resent to  [facility] Phone: [phone] Fax; [phone]  Pt pt is requesting a 90-day supply due to lower cost.

## 2022-11-01 LAB — COLOGUARD: COLOGUARD: NEGATIVE

## 2022-11-06 MED ORDER — AJOVY 225 MG/1.5ML ~~LOC~~ SOSY
225.0000 mg | PREFILLED_SYRINGE | SUBCUTANEOUS | 3 refills | Status: DC
Start: 2022-11-06 — End: 2023-05-28

## 2022-11-06 NOTE — Telephone Encounter (Signed)
Pt asking to speak with Andrey Campanile, RN re: call from 9-4

## 2022-11-06 NOTE — Telephone Encounter (Signed)
I called pt and she will take the 90 day supply to her [facility].  I could not find the # that she gave me but hopefully will get to her.

## 2022-11-06 NOTE — Addendum Note (Signed)
Addended by: Guy Begin on: 11/06/2022 04:08 PM   Modules accepted: Orders

## 2022-11-07 ENCOUNTER — Ambulatory Visit: Payer: BC Managed Care – PPO | Admitting: Physician Assistant

## 2022-11-07 ENCOUNTER — Telehealth: Payer: Self-pay | Admitting: Physical Therapy

## 2022-11-07 ENCOUNTER — Encounter: Payer: Self-pay | Admitting: Physician Assistant

## 2022-11-07 VITALS — BP 120/82 | HR 65 | Temp 97.5°F | Ht 64.0 in | Wt 174.0 lb

## 2022-11-07 DIAGNOSIS — G43809 Other migraine, not intractable, without status migrainosus: Secondary | ICD-10-CM

## 2022-11-07 DIAGNOSIS — E663 Overweight: Secondary | ICD-10-CM | POA: Diagnosis not present

## 2022-11-07 DIAGNOSIS — E785 Hyperlipidemia, unspecified: Secondary | ICD-10-CM

## 2022-11-07 DIAGNOSIS — M545 Low back pain, unspecified: Secondary | ICD-10-CM | POA: Diagnosis not present

## 2022-11-07 DIAGNOSIS — Z0001 Encounter for general adult medical examination with abnormal findings: Secondary | ICD-10-CM

## 2022-11-07 DIAGNOSIS — G8929 Other chronic pain: Secondary | ICD-10-CM

## 2022-11-07 LAB — CBC WITH DIFFERENTIAL/PLATELET
Basophils Absolute: 0 10*3/uL (ref 0.0–0.1)
Basophils Relative: 0.8 % (ref 0.0–3.0)
Eosinophils Absolute: 0.1 10*3/uL (ref 0.0–0.7)
Eosinophils Relative: 1.6 % (ref 0.0–5.0)
HCT: 40 % (ref 36.0–46.0)
Hemoglobin: 12.7 g/dL (ref 12.0–15.0)
Lymphocytes Relative: 26.2 % (ref 12.0–46.0)
Lymphs Abs: 1.3 10*3/uL (ref 0.7–4.0)
MCHC: 31.7 g/dL (ref 30.0–36.0)
MCV: 82.4 fl (ref 78.0–100.0)
Monocytes Absolute: 0.4 10*3/uL (ref 0.1–1.0)
Monocytes Relative: 8 % (ref 3.0–12.0)
Neutro Abs: 3.2 10*3/uL (ref 1.4–7.7)
Neutrophils Relative %: 63.4 % (ref 43.0–77.0)
Platelets: 260 10*3/uL (ref 150.0–400.0)
RBC: 4.85 Mil/uL (ref 3.87–5.11)
RDW: 16.1 % — ABNORMAL HIGH (ref 11.5–15.5)
WBC: 5 10*3/uL (ref 4.0–10.5)

## 2022-11-07 LAB — COMPREHENSIVE METABOLIC PANEL
ALT: 9 U/L (ref 0–35)
AST: 17 U/L (ref 0–37)
Albumin: 4.3 g/dL (ref 3.5–5.2)
Alkaline Phosphatase: 71 U/L (ref 39–117)
BUN: 16 mg/dL (ref 6–23)
CO2: 30 mEq/L (ref 19–32)
Calcium: 9.2 mg/dL (ref 8.4–10.5)
Chloride: 106 mEq/L (ref 96–112)
Creatinine, Ser: 0.73 mg/dL (ref 0.40–1.20)
GFR: 86.83 mL/min (ref >60.00)
Glucose, Bld: 106 mg/dL — ABNORMAL HIGH (ref 70–99)
Potassium: 4.4 mEq/L (ref 3.5–5.1)
Sodium: 143 mEq/L (ref 135–145)
Total Bilirubin: 0.5 mg/dL (ref 0.2–1.2)
Total Protein: 6.9 g/dL (ref 6.0–8.3)

## 2022-11-07 LAB — LIPID PANEL
Cholesterol: 150 mg/dL (ref 0–200)
HDL: 53.3 mg/dL (ref >39.00)
LDL Cholesterol: 79 mg/dL (ref 0–99)
NonHDL: 96.72
Total CHOL/HDL Ratio: 3
Triglycerides: 88 mg/dL (ref 0.0–149.0)
VLDL: 17.6 mg/dL (ref 0.0–40.0)

## 2022-11-07 LAB — HEMOGLOBIN A1C: Hgb A1c MFr Bld: 6.1 % (ref 4.6–6.5)

## 2022-11-07 NOTE — Patient Instructions (Addendum)
It was great to see you!  I will place order for physical therapy at the pool with [facility].  Please go to the lab for blood work.   Our office will call you with your results unless you have chosen to receive results via MyChart.  If your blood work is normal we will follow-up each year for physicals and as scheduled for chronic medical problems.  If anything is abnormal we will treat accordingly and get you in for a follow-up.  Take care,  Lelon Mast

## 2022-11-07 NOTE — Progress Notes (Signed)
Subjective:    Alison Mahoney is a 62 y.o. female and is here for a comprehensive physical exam.  HPI  There are no preventive care reminders to display for this patient.   Acute Concerns: None  Chronic Issues: Migraine Headaches Treated with sumatriptan 100 mg every two hours as needed. Seen by NP Amy Lomax 05/28/22 for chronic migraine and started on Ajovy 225 mg every thirty days. She has a headache today, the first she has had in some time.  Chronic low back pain Reports she has had pain in her left-lower back and left hip for years. She is taking meloxicam 15 mg daily. The pain is waking her up at night frequently. Hot baths help. Denies tenderness on hip bone. She is agreeable to starting PT.  Hypertension Treated with losartan-hydrochlorothiazide 50-12.5 mg daily. Blood pressure normal today at 120/82. She checks her blood pressure at home occasionally.  Hyperlipidemia Treated with pravastatin 80 mg daily.  Labs are pending.  Health Maintenance: Immunizations -- UTD on tetanus, shingles vaccines.  Colonoscopy -- Negative Cologuard 10/22/22. Mammogram -- Last completed 07/17/22. No mammographic evidence of malignancy. Repeat recommended in 2025. PAP -- Status post abdominal hysterectomy with BSO. Bone Density -- Due for first screening next year. Diet -- She is eating a low-inflammatory diet focusing on low-starch, high-protein foods. Exercise -- She is doing light exercise and is interested in water aerobics.  Sleep habits -- Wakes up intermittently with left hip pain. Mood -- Stable  UTD with dentist? - Yes UTD with eye doctor? - Yes  Weight history: Wt Readings from Last 10 Encounters:  11/07/22 174 lb (78.9 kg)  05/28/22 183 lb 8 oz (83.2 kg)  10/06/21 182 lb 6.1 oz (82.7 kg)  05/24/21 186 lb 6.4 oz (84.6 kg)  04/14/21 188 lb 4 oz (85.4 kg)  03/24/21 189 lb (85.7 kg)  02/28/21 189 lb (85.7 kg)  02/24/21 189 lb 2 oz (85.8 kg)  01/25/21 191 lb 6.1  oz (86.8 kg)  11/09/20 189 lb (85.7 kg)   Body mass index is 29.87 kg/m. No LMP recorded. Patient has had a hysterectomy.  Alcohol use:  reports no history of alcohol use.  Tobacco use:  Tobacco Use: Low Risk  (11/07/2022)   Patient History    Smoking Tobacco Use: Never    Smokeless Tobacco Use: Never    Passive Exposure: Not on file   Eligible for lung cancer screening? No     11/07/2022    8:56 AM  Depression screen PHQ 2/9  Decreased Interest 0  Down, Depressed, Hopeless 0  PHQ - 2 Score 0     Other providers/specialists: Patient Care Team: [name], [place] as PCP - General (Physician Assistant) Sedalia Muta, PT as Physical Therapist (Physical Therapy)    PMHx, SurgHx, SocialHx, Medications, and Allergies were reviewed in the Visit Navigator and updated as appropriate.   Past Medical History:  Diagnosis Date   Ankylosing spondylitis (HCC)    35 years ago   Hyperlipidemia    Hypertension    Migraines      Past Surgical History:  Procedure Laterality Date   ABDOMINAL HYSTERECTOMY  1988   COLONOSCOPY     lt hand surgery  1985   Lt middle toe surgery  1977   OOPHORECTOMY Bilateral 08/05/2019   also removed tumors from L & R ovary; benign    ovarian tumor removal Left 08/05/2019   2 lb, benign; @ [facility]   Rt thumb joint replacement  1982   SALPINGECTOMY Bilateral 08/05/2019   vaginal delivery x3  1979, 1982, 1986     Family History  Problem Relation Age of Onset   Arthritis Mother    Alzheimer's disease Mother    Arthritis Father    Diabetes Brother    Cancer Maternal Grandmother    Cancer Paternal Grandmother    Cancer Paternal Grandfather    Diabetes Paternal Grandfather    Healthy Daughter    Healthy Son    Healthy Son    Colon cancer Neg Hx    Esophageal cancer Neg Hx    Pancreatic cancer Neg Hx    Stomach cancer Neg Hx     Social History   Tobacco Use   Smoking status: Never   Smokeless tobacco: Never  Vaping Use   Vaping  status: Never Used  Substance Use Topics   Alcohol use: Never   Drug use: Never    Review of Systems:   Review of Systems  Constitutional:  Negative for chills, fever, malaise/fatigue and weight loss.  HENT:  Negative for hearing loss, sinus pain and sore throat.   Respiratory:  Negative for cough, hemoptysis and shortness of breath.   Cardiovascular:  Negative for chest pain, palpitations, leg swelling and PND.  Gastrointestinal:  Negative for abdominal pain, constipation, diarrhea, heartburn, nausea and vomiting.  Genitourinary:  Negative for dysuria, frequency and urgency.  Musculoskeletal:  Positive for back pain (Left-lower) and joint pain (Left hip). Negative for myalgias and neck pain.  Skin:  Negative for itching and rash.  Neurological:  Positive for headaches. Negative for dizziness, tingling and seizures.  Endo/Heme/Allergies:  Negative for polydipsia.  Psychiatric/Behavioral:  Negative for depression. The patient is not nervous/anxious.     Objective:   BP 120/82 (BP Location: Left Arm, Patient Position: Sitting, Cuff Size: Large)   Pulse 65   Temp (!) 97.5 F (36.4 C) (Temporal)   Ht 5\' 4"  (1.626 m)   Wt 174 lb (78.9 kg)   SpO2 98%   BMI 29.87 kg/m  Body mass index is 29.87 kg/m.   General Appearance:    Alert, cooperative, no distress, appears stated age  Head:    Normocephalic, without obvious abnormality, atraumatic  Eyes:    PERRL, conjunctiva/corneas clear, EOM's intact, fundi    benign, both eyes  Ears:    Normal TM's and external ear canals, both ears  Nose:   Nares normal, septum midline, mucosa normal, no drainage    or sinus tenderness  Throat:   Lips, mucosa, and tongue normal; teeth and gums normal  Neck:   Supple, symmetrical, trachea midline, no adenopathy;    thyroid:  no enlargement/tenderness/nodules; no carotid   bruit or JVD  Back:     Symmetric, no curvature, ROM normal, no CVA tenderness  Lungs:     Clear to auscultation bilaterally,  respirations unlabored  Chest Wall:    No tenderness or deformity   Heart:    Regular rate and rhythm, S1 and S2 normal, no murmur, rub or gallop  Breast Exam:    Deferred  Abdomen:     Soft, non-tender, bowel sounds active all four quadrants,    no masses, no organomegaly  Genitalia:    Deferred  Extremities:   Extremities normal, atraumatic, no cyanosis or edema  Pulses:   2+ and symmetric all extremities  Skin:   Skin color, texture, turgor normal, no rashes or lesions  Lymph nodes:   Cervical, supraclavicular, and axillary nodes normal  Neurologic:   CNII-XII intact, normal strength, sensation and reflexes    throughout    Assessment/Plan:   Encounter for general adult medical examination with abnormal findings Today patient counseled on age appropriate routine health concerns for screening and prevention, each reviewed and up to date or declined. Immunizations reviewed and up to date or declined. Labs ordered and reviewed. Risk factors for depression reviewed and negative. Hearing function and visual acuity are intact. ADLs screened and addressed as needed. Functional ability and level of safety reviewed and appropriate. Education, counseling and referrals performed based on assessed risks today. Patient provided with a copy of personalized plan for preventive services.  Chronic left-sided low back pain without sciatica Will refer for physical therapy If no significant change/improvement will refer to sports medicine  Hyperlipidemia, unspecified hyperlipidemia type Update lipid panel and adjust pravastatin 80 mg daily  Overweight Continue efforts at healthy lifestyle Encouraged her to trial aquatic therapy  Other migraine without status migrainosus, not intractable Continue close follow-up with neurology for ongoing management       I,Alexander Ruley,acting as a scribe for [name], PA.,have documented all relevant documentation on the behalf of [name], PA,as  directed by  [name], PA while in the presence of [name], [place].     I, [name], [place], have reviewed all documentation for this visit. The documentation on 11/07/22 for the exam, diagnosis, procedures, and orders are all accurate and complete.      [name], PA-C

## 2022-11-07 NOTE — Telephone Encounter (Signed)
Spoke with Patient-states will consider

## 2022-11-07 NOTE — Telephone Encounter (Signed)
Pt currently on [facility] for PT for low back pain and possible aquatics. Spoke with primary care provider about trying land PT first as aquatics has long wait-list.   Left voicemail explaining this and offering PT at [facility] location and to call back and schedule with either therapist if interested.   1:58 PM, 11/07/22 Tereasa Coop, DPT Physical Therapy with [facility]

## 2022-11-20 ENCOUNTER — Other Ambulatory Visit: Payer: Self-pay | Admitting: Physician Assistant

## 2022-12-04 ENCOUNTER — Other Ambulatory Visit: Payer: Self-pay | Admitting: Physician Assistant
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
## 2022-12-31 ENCOUNTER — Encounter (HOSPITAL_BASED_OUTPATIENT_CLINIC_OR_DEPARTMENT_OTHER): Payer: Self-pay | Admitting: Physical Therapy

## 2022-12-31 ENCOUNTER — Ambulatory Visit (HOSPITAL_BASED_OUTPATIENT_CLINIC_OR_DEPARTMENT_OTHER): Payer: BC Managed Care – PPO | Attending: Physician Assistant | Admitting: Physical Therapy

## 2022-12-31 ENCOUNTER — Other Ambulatory Visit: Payer: Self-pay

## 2022-12-31 DIAGNOSIS — M6281 Muscle weakness (generalized): Secondary | ICD-10-CM | POA: Insufficient documentation

## 2022-12-31 DIAGNOSIS — G8929 Other chronic pain: Secondary | ICD-10-CM | POA: Diagnosis not present

## 2022-12-31 DIAGNOSIS — M5459 Other low back pain: Secondary | ICD-10-CM | POA: Insufficient documentation

## 2023-01-21 ENCOUNTER — Other Ambulatory Visit: Payer: Self-pay | Admitting: Physician Assistant

## 2023-01-23 ENCOUNTER — Encounter (HOSPITAL_BASED_OUTPATIENT_CLINIC_OR_DEPARTMENT_OTHER): Payer: Self-pay | Admitting: Physical Therapy

## 2023-01-23 ENCOUNTER — Ambulatory Visit (HOSPITAL_BASED_OUTPATIENT_CLINIC_OR_DEPARTMENT_OTHER): Payer: BC Managed Care – PPO | Attending: Physician Assistant | Admitting: Physical Therapy

## 2023-01-23 DIAGNOSIS — M5459 Other low back pain: Secondary | ICD-10-CM | POA: Diagnosis present

## 2023-01-23 DIAGNOSIS — M6281 Muscle weakness (generalized): Secondary | ICD-10-CM | POA: Diagnosis present
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
## 2023-01-30 ENCOUNTER — Ambulatory Visit (HOSPITAL_BASED_OUTPATIENT_CLINIC_OR_DEPARTMENT_OTHER): Payer: BC Managed Care – PPO | Admitting: Physical Therapy

## 2023-01-30 ENCOUNTER — Encounter (HOSPITAL_BASED_OUTPATIENT_CLINIC_OR_DEPARTMENT_OTHER): Payer: Self-pay | Admitting: Physical Therapy

## 2023-01-30 DIAGNOSIS — M6281 Muscle weakness (generalized): Secondary | ICD-10-CM

## 2023-01-30 DIAGNOSIS — M5459 Other low back pain: Secondary | ICD-10-CM | POA: Diagnosis not present
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
## 2023-02-08 ENCOUNTER — Encounter (HOSPITAL_BASED_OUTPATIENT_CLINIC_OR_DEPARTMENT_OTHER): Payer: Self-pay | Admitting: Physical Therapy

## 2023-02-08 ENCOUNTER — Ambulatory Visit (HOSPITAL_BASED_OUTPATIENT_CLINIC_OR_DEPARTMENT_OTHER): Payer: BC Managed Care – PPO | Admitting: Physical Therapy

## 2023-02-08 DIAGNOSIS — M5459 Other low back pain: Secondary | ICD-10-CM | POA: Diagnosis not present

## 2023-02-08 DIAGNOSIS — M6281 Muscle weakness (generalized): Secondary | ICD-10-CM
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
## 2023-02-12 ENCOUNTER — Ambulatory Visit (HOSPITAL_BASED_OUTPATIENT_CLINIC_OR_DEPARTMENT_OTHER): Payer: Self-pay | Admitting: Physical Therapy

## 2023-02-16 ENCOUNTER — Encounter (HOSPITAL_BASED_OUTPATIENT_CLINIC_OR_DEPARTMENT_OTHER): Payer: Self-pay | Admitting: Physical Therapy

## 2023-02-16 ENCOUNTER — Ambulatory Visit (HOSPITAL_BASED_OUTPATIENT_CLINIC_OR_DEPARTMENT_OTHER): Payer: Self-pay | Attending: Physician Assistant | Admitting: Physical Therapy

## 2023-02-16 DIAGNOSIS — M6281 Muscle weakness (generalized): Secondary | ICD-10-CM | POA: Insufficient documentation

## 2023-02-16 DIAGNOSIS — M5459 Other low back pain: Secondary | ICD-10-CM | POA: Diagnosis present
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
## 2023-02-20 ENCOUNTER — Encounter (HOSPITAL_BASED_OUTPATIENT_CLINIC_OR_DEPARTMENT_OTHER): Payer: BC Managed Care – PPO | Admitting: Physical Therapy

## 2023-02-23 ENCOUNTER — Ambulatory Visit (HOSPITAL_BASED_OUTPATIENT_CLINIC_OR_DEPARTMENT_OTHER): Payer: 59

## 2023-02-27 ENCOUNTER — Encounter (HOSPITAL_BASED_OUTPATIENT_CLINIC_OR_DEPARTMENT_OTHER): Payer: BC Managed Care – PPO | Admitting: Physical Therapy

## 2023-03-02 ENCOUNTER — Encounter (HOSPITAL_BASED_OUTPATIENT_CLINIC_OR_DEPARTMENT_OTHER): Payer: Self-pay | Admitting: Physical Therapy

## 2023-03-02 ENCOUNTER — Ambulatory Visit (HOSPITAL_BASED_OUTPATIENT_CLINIC_OR_DEPARTMENT_OTHER): Payer: 59 | Admitting: Physical Therapy

## 2023-03-02 DIAGNOSIS — M5459 Other low back pain: Secondary | ICD-10-CM | POA: Diagnosis not present

## 2023-03-02 DIAGNOSIS — M6281 Muscle weakness (generalized): Secondary | ICD-10-CM
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
## 2023-03-02 NOTE — Patient Instructions (Signed)

## 2023-03-03 ENCOUNTER — Other Ambulatory Visit: Payer: Self-pay | Admitting: Physician Assistant

## 2023-03-06 ENCOUNTER — Encounter (HOSPITAL_BASED_OUTPATIENT_CLINIC_OR_DEPARTMENT_OTHER): Payer: BC Managed Care – PPO | Admitting: Physical Therapy

## 2023-03-09 ENCOUNTER — Encounter (HOSPITAL_BASED_OUTPATIENT_CLINIC_OR_DEPARTMENT_OTHER): Payer: Self-pay | Admitting: Physical Therapy

## 2023-03-13 ENCOUNTER — Encounter (HOSPITAL_BASED_OUTPATIENT_CLINIC_OR_DEPARTMENT_OTHER): Payer: BC Managed Care – PPO | Admitting: Physical Therapy

## 2023-03-14 ENCOUNTER — Other Ambulatory Visit: Payer: Self-pay | Admitting: Physician Assistant

## 2023-03-16 ENCOUNTER — Ambulatory Visit (HOSPITAL_BASED_OUTPATIENT_CLINIC_OR_DEPARTMENT_OTHER): Payer: 59 | Admitting: Physical Therapy

## 2023-03-25 ENCOUNTER — Ambulatory Visit (INDEPENDENT_AMBULATORY_CARE_PROVIDER_SITE_OTHER): Payer: 59 | Admitting: Physician Assistant

## 2023-03-25 ENCOUNTER — Telehealth: Payer: Self-pay | Admitting: Physician Assistant

## 2023-03-25 ENCOUNTER — Encounter: Payer: Self-pay | Admitting: Physician Assistant

## 2023-03-25 ENCOUNTER — Telehealth: Payer: Self-pay | Admitting: *Deleted

## 2023-03-25 VITALS — BP 138/80 | HR 58 | Temp 97.0°F | Ht 64.0 in | Wt 162.4 lb

## 2023-03-25 DIAGNOSIS — E88819 Insulin resistance, unspecified: Secondary | ICD-10-CM | POA: Diagnosis not present

## 2023-03-25 DIAGNOSIS — R634 Abnormal weight loss: Secondary | ICD-10-CM

## 2023-03-25 DIAGNOSIS — M79622 Pain in left upper arm: Secondary | ICD-10-CM

## 2023-03-25 LAB — CBC WITH DIFFERENTIAL/PLATELET
Basophils Absolute: 0 10*3/uL (ref 0.0–0.1)
Basophils Relative: 1 % (ref 0.0–3.0)
Eosinophils Absolute: 0 10*3/uL (ref 0.0–0.7)
Eosinophils Relative: 1.1 % (ref 0.0–5.0)
HCT: 44.1 % (ref 36.0–46.0)
Hemoglobin: 14.2 g/dL (ref 12.0–15.0)
Lymphocytes Relative: 26.9 % (ref 12.0–46.0)
Lymphs Abs: 1.1 10*3/uL (ref 0.7–4.0)
MCHC: 32.3 g/dL (ref 30.0–36.0)
MCV: 86.3 fL (ref 78.0–100.0)
Monocytes Absolute: 0.3 10*3/uL (ref 0.1–1.0)
Monocytes Relative: 7.7 % (ref 3.0–12.0)
Neutro Abs: 2.6 10*3/uL (ref 1.4–7.7)
Neutrophils Relative %: 63.3 % (ref 43.0–77.0)
Platelets: 221 10*3/uL (ref 150.0–400.0)
RBC: 5.11 Mil/uL (ref 3.87–5.11)
RDW: 16.6 % — ABNORMAL HIGH (ref 11.5–15.5)
WBC: 4.2 10*3/uL (ref 4.0–10.5)

## 2023-03-25 LAB — COMPREHENSIVE METABOLIC PANEL
ALT: 8 U/L (ref 0–35)
AST: 16 U/L (ref 0–37)
Albumin: 4.6 g/dL (ref 3.5–5.2)
Alkaline Phosphatase: 64 U/L (ref 39–117)
BUN: 19 mg/dL (ref 6–23)
CO2: 28 meq/L (ref 19–32)
Calcium: 9.5 mg/dL (ref 8.4–10.5)
Chloride: 105 meq/L (ref 96–112)
Creatinine, Ser: 0.68 mg/dL (ref 0.40–1.20)
GFR: 91.71 mL/min (ref >60.00)
Glucose, Bld: 104 mg/dL — ABNORMAL HIGH (ref 70–99)
Potassium: 4.6 meq/L (ref 3.5–5.1)
Sodium: 142 meq/L (ref 135–145)
Total Bilirubin: 0.6 mg/dL (ref 0.2–1.2)
Total Protein: 7.1 g/dL (ref 6.0–8.3)

## 2023-03-25 LAB — URINALYSIS, ROUTINE W REFLEX MICROSCOPIC
Bilirubin Urine: NEGATIVE
Hgb urine dipstick: NEGATIVE
Ketones, ur: NEGATIVE
Leukocytes,Ua: NEGATIVE
Nitrite: NEGATIVE
RBC / HPF: NONE SEEN (ref 0–?)
Specific Gravity, Urine: 1.025 (ref 1.000–1.030)
Total Protein, Urine: NEGATIVE
Urine Glucose: NEGATIVE
Urobilinogen, UA: 0.2 (ref 0.0–1.0)
pH: 6 (ref 5.0–8.0)

## 2023-03-25 LAB — IBC + FERRITIN
Ferritin: 6.4 ng/mL — ABNORMAL LOW (ref 10.0–291.0)
Iron: 86 ug/dL (ref 42–145)
Saturation Ratios: 19.3 % — ABNORMAL LOW (ref 20.0–50.0)
TIBC: 445.2 ug/dL (ref 250.0–450.0)
Transferrin: 318 mg/dL (ref 212.0–360.0)

## 2023-03-25 LAB — TSH: TSH: 1.92 u[IU]/mL (ref 0.35–5.50)

## 2023-03-25 LAB — POCT GLYCOSYLATED HEMOGLOBIN (HGB A1C): Hemoglobin A1C: 5.5 % (ref 4.0–5.6)

## 2023-03-25 NOTE — Telephone Encounter (Signed)
 Pt is requesting a new Urinalysis due to not being happy with the results of today's earlier test. Please advise

## 2023-03-25 NOTE — Telephone Encounter (Signed)
 Copied from CRM [id number]. Topic: Clinical - Request for Lab/Test Order >> Mar 25, 2023  3:31 PM Alison Mahoney F wrote: Reason for CRM: Patient would like to have another urine sample done since she only gave a tiny sample (and she already seen her results) - I explained to her (per clinical access line) that a order has to be put in first then she will get a call to schedule. Patient insisted, since she lives 40 minutes away and says she going to head back to the office anyway. Also says she understands if she heads back to the office and the answer is still no. Then call dropped, patient couldn't hear me

## 2023-03-25 NOTE — Progress Notes (Signed)
 Alison Mahoney is a 63 y.o. female here for a new problem.  History of Present Illness:   Chief Complaint  Patient presents with   Unintentional weight loss    Pt is concerned about weight loss.    HPI  Weight Loss Pt reports experiencing unintentional weight loss for the past 1.5 years.  She has been keeping a fasting glucose levels. January log ranges fro, 90's to 116's. She does report having a brother with insulin-depended diabetes.  She has been lost about 20 pounds in the past year, she does report decreasing her carb intake and wonders if that is the main reason to her weight loss. She was eating dried fruits over the summer but she has weaned off of it.  Her diet is typically well balanced and healthy, protein is incorporated in every meal and plenty of diary products. Her breakfast typically consist of yogurt with nuts and fruits.  She doesn't typically go more than 2-3 hours without having any food, typically snacks in between as well. She reports occasionally doing aquatic therapy, less than 1 x week on average She typically drinks water and coffee, no sugary drinks.  She does endorse having to wake up to urinate but states that is typical of her. She also reports occasional rectal bleeding but attributes that to an internal hemorrhoids.  Her las colonoscopy was 2023, cologuard in 2024.  She denies any excessive thirst, night sweats, abdominal pain, or low energy levels. Denies any recent sickness.  A1c today is 5.5.  Intermittent left axillary discomfort  She reports occasionally experiencing a discomfort in her left axillary  that she typically feels when leaning over.  Denies any lumps in the region.  Denies any symptom(s) currently. Her mammogram is up to date.  No further concerns.   Past Medical History:  Diagnosis Date   Ankylosing spondylitis (HCC)    35 years ago   Hyperlipidemia    Hypertension    Migraines      Social History   Tobacco Use   Smoking  status: Never   Smokeless tobacco: Never  Vaping Use   Vaping status: Never Used  Substance Use Topics   Alcohol use: Never   Drug use: Never    Past Surgical History:  Procedure Laterality Date   ABDOMINAL HYSTERECTOMY  1988   COLONOSCOPY     lt hand surgery  1985   Lt middle toe surgery  1977   OOPHORECTOMY Bilateral 08/05/2019   also removed tumors from L & R ovary; benign    ovarian tumor removal Left 08/05/2019   2 lb, benign; @ [facility]   Rt thumb joint replacement  1982   SALPINGECTOMY Bilateral 08/05/2019   vaginal delivery x3  1979, 1982, 1986    Family History  Problem Relation Age of Onset   Arthritis Mother    Alzheimer's disease Mother    Arthritis Father    Diabetes Brother    Cancer Maternal Grandmother    Cancer Paternal Grandmother    Cancer Paternal Grandfather    Diabetes Paternal Grandfather    Healthy Daughter    Healthy Son    Healthy Son    Colon cancer Neg Hx    Esophageal cancer Neg Hx    Pancreatic cancer Neg Hx    Stomach cancer Neg Hx     Allergies  Allergen Reactions   Inderal [Propranolol] Rash   Tomato Rash    Current Medications:   Current Outpatient Medications:  b complex vitamins tablet, Take 1 tablet by mouth daily., Disp: , Rfl:    Fremanezumab -vfrm (AJOVY ) 225 MG/1.5ML SOSY, Inject 225 mg into the skin every 30 (thirty) days., Disp: 4.5 mL, Rfl: 3   losartan -hydrochlorothiazide  (HYZAAR) 50-12.5 MG tablet, TAKE 1 TABLET BY MOUTH EVERY DAY, Disp: 90 tablet, Rfl: 1   meloxicam  (MOBIC ) 15 MG tablet, TAKE 1 TABLET (15 MG TOTAL) BY MOUTH DAILY., Disp: 30 tablet, Rfl: 2   polyethylene glycol (MIRALAX / GLYCOLAX) 17 g packet, Take 17 g by mouth daily as needed., Disp: , Rfl:    pravastatin  (PRAVACHOL ) 80 MG tablet, TAKE 1 TABLET BY MOUTH EVERY DAY, Disp: 90 tablet, Rfl: 1   SUMAtriptan  (IMITREX ) 100 MG tablet, TAKE 1 TABLET EVERY 2 (TWO) HOURS AS NEEDED FOR MIGRAINE. AS NEEDED FOR MIGRAINES, Disp: 9 tablet, Rfl: 1   Review of  Systems:   Review of Systems  Constitutional:  Positive for weight loss. Negative for malaise/fatigue.  Gastrointestinal:  Negative for abdominal pain.       +rectal bleeding  Genitourinary:  Positive for frequency.    Vitals:   Vitals:   03/25/23 0835  BP: 138/80  Pulse: (!) 58  Temp: (!) 97 F (36.1 C)  TempSrc: Temporal  SpO2: 98%  Weight: 162 lb 6.1 oz (73.7 kg)  Height: 5\' 4"  (1.626 m)     Body mass index is 27.87 kg/m.  Physical Exam:   Physical Exam Vitals and nursing note reviewed.  Constitutional:      General: She is not in acute distress.    Appearance: She is well-developed. She is not ill-appearing or toxic-appearing.  Cardiovascular:     Rate and Rhythm: Normal rate and regular rhythm.     Pulses: Normal pulses.     Heart sounds: Normal heart sounds, S1 normal and S2 normal.  Pulmonary:     Effort: Pulmonary effort is normal.     Breath sounds: Normal breath sounds.  Abdominal:     General: Abdomen is flat. Bowel sounds are normal.     Palpations: Abdomen is soft.     Tenderness: There is no abdominal tenderness.  Lymphadenopathy:     Cervical: No cervical adenopathy.  Skin:    General: Skin is warm and dry.  Neurological:     Mental Status: She is alert.     GCS: GCS eye subscore is 4. GCS verbal subscore is 5. GCS motor subscore is 6.  Psychiatric:        Speech: Speech normal.        Behavior: Behavior normal. Behavior is cooperative.    Results for orders placed or performed in visit on 03/25/23  POCT HgB A1C  Result Value Ref Range   Hemoglobin A1C 5.5 4.0 - 5.6 %     Assessment and Plan:   Unintentional weight loss Unclear etiology She is UpToDate on all cancer screenings Will obtain blood work, urinalysis and ifob Discussed need for protein at each meal/snack Continue weekly weights Next steps may include: ultrasound of axilla, pan scan CT of chest/abdomen/pel  If any new symptom(s) at any time -- needs to let us  know    Insulin resistance Hemoglobin A1c today wnl Recommend continued healthy eating efforts Will monitor annually  Left axillary pain Unable to palpate on exam Consider imaging if no explanation for ongoing weight issues    Alexander Iba, PA-C  I,Safa M Kadhim,acting as a scribe for [employer], PA.,have documented all relevant documentation on the behalf of Ova Bloomer  Roark Chick, PA,as directed by  Alexander Iba, PA while in the presence of [place], [place].   I, [place], [place], have reviewed all documentation for this visit. The documentation on 03/25/23 for the exam, diagnosis, procedures, and orders are all accurate and complete.

## 2023-03-25 NOTE — Telephone Encounter (Signed)
Pt aware, see other message

## 2023-03-25 NOTE — Telephone Encounter (Signed)
 Spoke to pt told her per [facility] your urine is overall normal nothing to be concerned about, but if you are persistent it can be repeated in 2 weeks not today. Pt verbalized understanding and said no, I am not persistent this is the only day I could come and I only gave a little for specimen and was concerned about squamous epithelial. Told her it is fine, no concern. Pt verbalized understanding.

## 2023-03-25 NOTE — Patient Instructions (Signed)
 It was great to see you!  We will update blood work and provide recommendations accordingly  Take care,  Evie Croston PA-C

## 2023-03-25 NOTE — Telephone Encounter (Signed)
 Please see message.

## 2023-03-26 ENCOUNTER — Encounter: Payer: Self-pay | Admitting: Physician Assistant

## 2023-04-09 ENCOUNTER — Other Ambulatory Visit: Payer: Self-pay | Admitting: Physician Assistant

## 2023-04-16 ENCOUNTER — Other Ambulatory Visit

## 2023-04-18 ENCOUNTER — Ambulatory Visit

## 2023-04-18 LAB — FECAL OCCULT BLOOD, IMMUNOCHEMICAL: Fecal Occult Bld: NEGATIVE

## 2023-05-27 NOTE — Progress Notes (Unsigned)
 No chief complaint on file.   HISTORY OF PRESENT ILLNESS:  05/27/23 ALL:  Alison Mahoney returns for follow up for migraines. She was last seen 05/2022 and doing well on Ajovy and sumatriptan. Since,   05/28/2022 ALL:  Alison Mahoney returns for follow up for migraines. She continues Ajovy and sumatriptan. She feels that she is well managed. She may have 3-4 migraines per month. Most are mild to mod. Sumatriptan works well. She did have a local skin reaction to Ajovy last year but has not happened again. She has noticed a feeling of being slightly off balance for the past few months. She admits to needing to drink more water. She reports BP is usually good at home. She continues losartan/hctz daily. She is followed regularly by PCP.   05/24/2021 ALL: Alison Mahoney returns for follow up for migraines. She continues Ajovy and sumatriptan. She feels that she is doing well. She does have headaches that seem to occur in cycles. Unsure if this correlates with timing of Ajovy. Overall, she feels headaches are well managed. She has 4-5 migraines on average. Sumatriptan usually works for abortive therapy. Last rx was different manufacturer and did not seem to work as well. She is starting a new job with [employer].   03/28/2020 ALL:  Alison Mahoney is a 63 y.o. female here today for follow up for migraines. She continues Ajovy and sumatriptan. She feels that she is doing well. Baseline was 15-20 migraine days, now 3-5. Headaches are not as severe and do not last as long. She does say that she tends to wake up with migraines. Once she gets up and gets ready, headache resolves. She denies snoring. No daytime sleepiness or dry mouth. She does note that her blood pressure fluctuates. She is on losartan/hctz 50-12.5mg  (reduced from 100/25 in June, 2021). She is watching her diet closely.   HISTORY (copied from Dr Trevor Mace previous note)  HPI:  Alison Mahoney is a 63 y.o. female here as requested by [name], PA for frequent headaches.PMHx  migraines, HTN, HD, Ankylosing spondylitis.  I reviewed Lelon Mast Worley's notes: She has tried multiple preventatives, had "all sorts of work up" at the [employer], has not had good relief. Tried: Topamax, elavil, b-blocker. From a thorough review of records, meds tried include: flexeril, diclofenac, naproxen, imitrex,inderal,  topamax, b-blocker, elevil. After review of Epic and Care Everywhere I do not see any brain imaging available.    She has had migraines since high school, they have not improved in menopause, stable frequency and severity, over years she has had many medications, Hers can last 2-3 days, Imitrex works the best acutely, right side mostly can also be on the left side, around the eye above the eye, they feel hot, throbbing, searing, photophobia the most, phonophobia, nausea, movement makes it worse, sometimes she can;t get out of the bed, a cold cloth helps. They can be moderately severe to severe. At least 15 migraine days a month. No medication overuse. She does not have aura frequently, maybe some word-finding problems. She can wake with headaches, she can have vision changes. But she does not think she has any new qualities, No other focal neurologic deficits, associated symptoms, inciting events or modifiable factors.   Reviewed notes, labs and imaging from outside physicians, which showed:   Cbc normal, CMP unremarkable(low platelets) 04/2019   REVIEW OF SYSTEMS: Out of a complete 14 system review of symptoms, the patient complains only of the following symptoms, headaches and all  other reviewed systems are negative.    ALLERGIES: Allergies  Allergen Reactions   Inderal [Propranolol] Rash   Tomato Rash     HOME MEDICATIONS: Outpatient Medications Prior to Visit  Medication Sig Dispense Refill   b complex vitamins tablet Take 1 tablet by mouth daily.     Fremanezumab-vfrm (AJOVY) 225 MG/1.5ML SOSY Inject 225 mg into the skin every 30 (thirty) days. 4.5  mL 3   losartan-hydrochlorothiazide (HYZAAR) 50-12.5 MG tablet TAKE 1 TABLET BY MOUTH EVERY DAY 90 tablet 1   meloxicam (MOBIC) 15 MG tablet TAKE 1 TABLET (15 MG TOTAL) BY MOUTH DAILY. 30 tablet 2   polyethylene glycol (MIRALAX / GLYCOLAX) 17 g packet Take 17 g by mouth daily as needed.     pravastatin (PRAVACHOL) 80 MG tablet TAKE 1 TABLET BY MOUTH EVERY DAY 90 tablet 1   SUMAtriptan (IMITREX) 100 MG tablet TAKE 1 TABLET EVERY 2 (TWO) HOURS AS NEEDED FOR MIGRAINE. AS NEEDED FOR MIGRAINES 9 tablet 1   No facility-administered medications prior to visit.     PAST MEDICAL HISTORY: Past Medical History:  Diagnosis Date   Ankylosing spondylitis (HCC)    35 years ago   Hyperlipidemia    Hypertension    Migraines      PAST SURGICAL HISTORY: Past Surgical History:  Procedure Laterality Date   ABDOMINAL HYSTERECTOMY  1988   COLONOSCOPY     lt hand surgery  1985   Lt middle toe surgery  1977   OOPHORECTOMY Bilateral 08/05/2019   also removed tumors from L & R ovary; benign    ovarian tumor removal Left 08/05/2019   2 lb, benign; @ [facility]   Rt thumb joint replacement  1982   SALPINGECTOMY Bilateral 08/05/2019   vaginal delivery x3  1979, 1982, 1986     FAMILY HISTORY: Family History  Problem Relation Age of Onset   Arthritis Mother    Alzheimer's disease Mother    Arthritis Father    Diabetes Brother    Cancer Maternal Grandmother    Cancer Paternal Grandmother    Cancer Paternal Grandfather    Diabetes Paternal Actor    Healthy Daughter    Healthy Son    Healthy Son    Colon cancer Neg Hx    Esophageal cancer Neg Hx    Pancreatic cancer Neg Hx    Stomach cancer Neg Hx      SOCIAL HISTORY: Social History   Socioeconomic History   Marital status: Single    Spouse name: Not on file   Number of children: 3   Years of education: Not on file   Highest education level: Not on file  Occupational History   Not on file  Tobacco Use   Smoking status: Never    Smokeless tobacco: Never  Vaping Use   Vaping status: Never Used  Substance and Sexual Activity   Alcohol use: Never   Drug use: Never   Sexual activity: Not Currently  Other Topics Concern   Not on file  Social History Narrative   Moved from [place] in [place]; 3 children   Currently works as a Therapist, 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      Lives alone   Right handed   Caffeine: 1 cup/day, rarely may have a second cup   Social Drivers of Corporate investment banker Strain: Low Risk  (08/06/2019)   [facility]   Overall Financial Resource Strain (CARDIA)  Difficulty of Paying Living Expenses: Not very hard  Food Insecurity: No Food Insecurity (08/06/2019)   Received [facility], [facility]   Hunger Vital Sign    Worried About Running Out of Food in the Last Year: Never true    Ran Out of Food in the Last Year: Never true  Transportation Needs: No Transportation Needs (08/06/2019)   Received from [facility], [facility]   [facility] - Transportation    Lack of Transportation (Medical): No    Lack of Transportation (Non-Medical): No  Physical Activity: Not on file  Stress: Not on file  Social Connections: Not on file  Intimate Partner Violence: Not on file      PHYSICAL EXAM  There were no vitals filed for this visit.   There is no height or weight on file to calculate BMI.   Generalized: Well developed, in no acute distress  Cardiology: normal rate and rhythm, no murmur auscultated  Respiratory: clear to auscultation bilaterally    Neurological examination  Mentation: Alert oriented to time, place, history taking. Follows all commands speech and language fluent Cranial nerve II-XII: Pupils were equal round reactive to light. Extraocular movements were full, visual field were full on confrontational test. Facial sensation and strength were normal. Head turning and shoulder shrug   were normal and symmetric. Motor: The motor testing reveals 5 over 5 strength of all 4 extremities. Good symmetric motor tone is noted throughout.  Gait and station: Gait is normal.     DIAGNOSTIC DATA (LABS, IMAGING, TESTING) - I reviewed patient records, labs, notes, testing and imaging myself where available.  Lab Results  Component Value Date   WBC 4.2 03/25/2023   HGB 14.2 03/25/2023   HCT 44.1 03/25/2023   MCV 86.3 03/25/2023   PLT 221.0 03/25/2023      Component Value Date/Time   NA 142 03/25/2023 0913   K 4.6 03/25/2023 0913   CL 105 03/25/2023 0913   CO2 28 03/25/2023 0913   GLUCOSE 104 (H) 03/25/2023 0913   BUN 19 03/25/2023 0913   CREATININE 0.68 03/25/2023 0913   CREATININE 0.68 10/06/2021 1628   CALCIUM 9.5 03/25/2023 0913   PROT 7.1 03/25/2023 0913   ALBUMIN 4.6 03/25/2023 0913   AST 16 03/25/2023 0913   ALT 8 03/25/2023 0913   ALKPHOS 64 03/25/2023 0913   BILITOT 0.6 03/25/2023 0913   GFRNONAA 95 04/29/2019 1412   GFRAA 110 04/29/2019 1412   Lab Results  Component Value Date   CHOL 150 11/07/2022   HDL 53.30 11/07/2022   LDLCALC 79 11/07/2022   TRIG 88.0 11/07/2022   CHOLHDL 3 11/07/2022   Lab Results  Component Value Date   HGBA1C 5.5 03/25/2023   No results found for: "VITAMINB12" Lab Results  Component Value Date   TSH 1.92 03/25/2023        No data to display               No data to display           ASSESSMENT AND PLAN  63 y.o. year old female  has a past medical history of Ankylosing spondylitis (HCC), Hyperlipidemia, Hypertension, and Migraines. here with   No diagnosis found.   Monifa is doing well, today. She reports improvement in severity and frequency of migraines on Ajovy and sumatriptan. We will continue current treatment plan. She has noted feeling slightly off balanced at times. I have asked her to monitor if this  may be correlated to timing of Ajovy. Could consider switching to Emgality versus Qulipta if  needed. Healthy lifestyle habits and regular follow up with PCP advised. She will follow up with me in 1 year, sooner if needed.    No orders of the defined types were placed in this encounter.    No orders of the defined types were placed in this encounter.     Terrilyn Fick, MSN, FNP-C 05/27/2023, 8:01 AM  [facility] [address] [place], [place] [address] [phone]

## 2023-05-27 NOTE — Patient Instructions (Incomplete)
 Below is our plan:  We will restart Ajovy every 30 days and continue sumatriptan as needed   [place] [facility] [phone]   Please make sure you are staying well hydrated. I recommend 50-60 ounces daily. Well balanced diet and regular exercise encouraged. Consistent sleep schedule with 6-8 hours recommended.   Please continue follow up with care team as directed.   Follow up with me in 1 year   You may receive a survey regarding today's visit. I encourage you to leave honest feed back as I do use this information to improve patient care. Thank you for seeing me today!   GENERAL HEADACHE INFORMATION:   Natural supplements: Magnesium Oxide or Magnesium Glycinate 500 mg at bed (up to 800 mg daily) Coenzyme Q10 300 mg in AM Vitamin B2- 200 mg twice a day   Add 1 supplement at a time since even natural supplements can have undesirable side effects. You can sometimes buy supplements cheaper (especially Coenzyme Q10) at www.WebmailGuide.co.za or at [facility].  Migraine with aura: There is increased risk for stroke in women with migraine with aura and a contraindication for the combined contraceptive pill for use by women who have migraine with aura. The risk for women with migraine without aura is lower. However other risk factors like smoking are far more likely to increase stroke risk than migraine. There is a recommendation for no smoking and for the use of OCPs without estrogen such as progestogen only pills particularly for women with migraine with aura.Aaron Aas People who have migraine headaches with auras may be 3 times more likely to have a stroke caused by a blood clot, compared to migraine patients who don't see auras. Women who take hormone-replacement therapy may be 30 percent more likely to suffer a clot-based stroke than women not taking medication containing estrogen. Other risk factors like smoking and high blood pressure may be  much more important.    Vitamins and  herbs that show potential:   Magnesium: Magnesium (250 mg twice a day or 500 mg at bed) has a relaxant effect on smooth muscles such as blood vessels. Individuals suffering from frequent or daily headache usually have low magnesium levels which can be increase with daily supplementation of 400-750 mg. Three trials found 40-90% average headache reduction  when used as a preventative. Magnesium may help with headaches are aura, the best evidence for magnesium is for migraine with aura is its thought to stop the cortical spreading depression we believe is the pathophysiology of migraine aura.Magnesium also demonstrated the benefit in menstrually related migraine.  Magnesium is part of the messenger system in the serotonin cascade and it is a good muscle relaxant.  It is also useful for constipation which can be a side effect of other medications used to treat migraine. Good sources include nuts, whole grains, and tomatoes. Side Effects: loose stool/diarrhea  Riboflavin (vitamin B 2) 200 mg twice a day. This vitamin assists nerve cells in the production of ATP a principal energy storing molecule.  It is necessary for many chemical reactions in the body.  There have been at least 3 clinical trials of riboflavin using 400 mg per day all of which suggested that migraine frequency can be decreased.  All 3 trials showed significant improvement in over half of migraine sufferers.  The supplement is found in bread, cereal, milk, meat, and poultry.  Most Americans get more riboflavin than the recommended daily allowance, however riboflavin deficiency is not necessary for the  supplements to help prevent headache. Side effects: energizing, green urine   Coenzyme Q10: This is present in almost all cells in the body and is critical component for the conversion of energy.  Recent studies have shown that a nutritional supplement of CoQ10 can reduce the frequency of migraine attacks by improving the energy production of cells as  with riboflavin.  Doses of 150 mg twice a day have been shown to be effective.   Melatonin: Increasing evidence shows correlation between melatonin secretion and headache conditions.  Melatonin supplementation has decreased headache intensity and duration.  It is widely used as a sleep aid.  Sleep is natures way of dealing with migraine.  A dose of 3 mg is recommended to start for headaches including cluster headache. Higher doses up to 15 mg has been reviewed for use in Cluster headache and have been used. The rationale behind using melatonin for cluster is that many theories regarding the cause of Cluster headache center around the disruption of the normal circadian rhythm in the brain.  This helps restore the normal circadian rhythm.   HEADACHE DIET: Foods and beverages which may trigger migraine Note that only 20% of headache patients are food sensitive. You will know if you are food sensitive if you get a headache consistently 20 minutes to 2 hours after eating a certain food. Only cut out a food if it causes headaches, otherwise you might remove foods you enjoy! What matters most for diet is to eat a well balanced healthy diet full of vegetables and low fat protein, and to not miss meals.   Chocolate, other sweets ALL cheeses except cottage and cream cheese Dairy products, yogurt, sour cream, ice cream Liver Meat extracts (Bovril, Marmite, meat tenderizers) Meats or fish which have undergone aging, fermenting, pickling or smoking. These include: Hotdogs,salami,Lox,sausage, mortadellas,smoked salmon, pepperoni, Pickled herring Pods of broad bean (English beans, Chinese pea pods, [place] (fava) beans, lima and navy beans Ripe avocado, ripe banana Yeast extracts or active yeast preparations such as Brewer's or Fleishman's (commercial bakes goods are permitted) Tomato based foods, pizza (lasagna, etc.)   MSG (monosodium glutamate) is disguised as many things; look for these common  aliases: Monopotassium glutamate Autolysed yeast Hydrolysed protein Sodium caseinate "flavorings" "all natural preservatives" Nutrasweet   Avoid all other foods that convincingly provoke headaches.   Resources: The Dizzy Althia Jetty Your Headache Diet, migrainestrong.com  https://zamora-andrews.com/   Caffeine and Migraine For patients that have migraine, caffeine intake more than 3 days per week can lead to dependency and increased migraine frequency. I would recommend cutting back on your caffeine intake as best you can. The recommended amount of caffeine is 200-300 mg daily, although migraine patients may experience dependency at even lower doses. While you may notice an increase in headache temporarily, cutting back will be helpful for headaches in the long run. For more information on caffeine and migraine, visit: https://americanmigrainefoundation.org/resource-library/caffeine-and-migraine/   Headache Prevention Strategies:   1. Maintain a headache diary; learn to identify and avoid triggers.  - This can be a simple note where you log when you had a headache, associated symptoms, and medications used - There are several smartphone apps developed to help track migraines: Migraine Buddy, Migraine Monitor, Curelator N1-Headache App   Common triggers include: Emotional triggers: Emotional/Upset family or friends Emotional/Upset occupation Business reversal/success Anticipation anxiety Crisis-serious Post-crisis periodNew job/position   Physical triggers: Vacation Day Weekend Strenuous Exercise High Altitude Location New Move Menstrual Day Physical Illness Oversleep/Not enough sleep Weather changes Light:  Photophobia or light sesnitivity treatment involves a balance between desensitization and reduction in overly strong input. Use dark polarized glasses outside, but not inside. Avoid bright or fluorescent light, but do not dim  environment to the point that going into a normally lit room hurts. Consider FL-41 tint lenses, which reduce the most irritating wavelengths without blocking too much light.  These can be obtained at axonoptics.com or theraspecs.com Foods: see list above.   2. Limit use of acute treatments (over-the-counter medications, triptans, etc.) to no more than 2 days per week or 10 days per month to prevent medication overuse headache (rebound headache).     3. Follow a regular schedule (including weekends and holidays): Don't skip meals. Eat a balanced diet. 8 hours of sleep nightly. Minimize stress. Exercise 30 minutes per day. Being overweight is associated with a 5 times increased risk of chronic migraine. Keep well hydrated and drink 6-8 glasses of water per day.   4. Initiate non-pharmacologic measures at the earliest onset of your headache. Rest and quiet environment. Relax and reduce stress. Breathe2Relax is a free app that can instruct you on    some simple relaxtion and breathing techniques. Http://Dawnbuse.com is a    free website that provides teaching videos on relaxation.  Also, there are  many apps that   can be downloaded for "mindful" relaxation.  An app called YOGA NIDRA will help walk you through mindfulness. Another app called Calm can be downloaded to give you a structured mindfulness guide with daily reminders and skill development. Headspace for guided meditation Mindfulness Based Stress Reduction Online Course: www.palousemindfulness.com Cold compresses.   5. Don't wait!! Take the maximum allowable dosage of prescribed medication at the first sign of migraine.   6. Compliance:  Take prescribed medication regularly as directed and at the first sign of a migraine.   7. Communicate:  Call your physician when problems arise, especially if your headaches change, increase in frequency/severity, or become associated with neurological symptoms (weakness, numbness, slurred speech, etc.).  Proceed to emergency room if you experience new or worsening symptoms or symptoms do not resolve, if you have new neurologic symptoms or if headache is severe, or for any concerning symptom.   8. Headache/pain management therapies: Consider various complementary methods, including medication, behavioral therapy, psychological counselling, biofeedback, massage therapy, acupuncture, dry needling, and other modalities.  Such measures may reduce the need for medications. Counseling for pain management, where patients learn to function and ignore/minimize their pain, seems to work very well.   9. Recommend changing family's attention and focus away from patient's headaches. Instead, emphasize daily activities. If first question of day is 'How are your headaches/Do you have a headache today?', then patient will constantly think about headaches, thus making them worse. Goal is to re-direct attention away from headaches, toward daily activities and other distractions.   10. Helpful Websites: www.AmericanHeadacheSociety.org PatentHood.ch www.headaches.org TightMarket.nl www.achenet.org

## 2023-05-28 ENCOUNTER — Encounter: Payer: Self-pay | Admitting: Family Medicine

## 2023-05-28 ENCOUNTER — Ambulatory Visit (INDEPENDENT_AMBULATORY_CARE_PROVIDER_SITE_OTHER): Payer: BC Managed Care – PPO | Admitting: Family Medicine

## 2023-05-28 ENCOUNTER — Other Ambulatory Visit (HOSPITAL_COMMUNITY): Payer: Self-pay

## 2023-05-28 ENCOUNTER — Telehealth: Payer: Self-pay | Admitting: Family Medicine

## 2023-05-28 VITALS — BP 128/83 | HR 100 | Ht 64.0 in | Wt 161.5 lb

## 2023-05-28 DIAGNOSIS — G43709 Chronic migraine without aura, not intractable, without status migrainosus: Secondary | ICD-10-CM | POA: Diagnosis not present

## 2023-05-28 MED ORDER — AJOVY 225 MG/1.5ML ~~LOC~~ SOSY
225.0000 mg | PREFILLED_SYRINGE | SUBCUTANEOUS | 3 refills | Status: AC
  Filled 2023-05-28 – 2023-06-05 (×2): qty 1.5, 30d supply, fill #0
  Filled 2023-06-05: qty 3, 60d supply, fill #0
  Filled 2023-07-01: qty 3, 60d supply, fill #1
  Filled 2023-08-28 – 2023-08-29 (×2): qty 1.5, 30d supply, fill #2
  Filled 2023-09-25: qty 3, 60d supply, fill #3
  Filled 2023-09-25 (×3): qty 1.5, 30d supply, fill #3
  Filled 2023-09-30 (×2): qty 3, 60d supply, fill #3
  Filled 2023-10-01: qty 1.5, 30d supply, fill #3
  Filled 2023-10-04: qty 4.5, 90d supply, fill #3
  Filled 2023-10-04 (×3): qty 1.5, 30d supply, fill #3
  Filled 2023-11-26 – 2023-12-18 (×2): qty 3, 60d supply, fill #4
  Filled 2024-02-15: qty 3, 60d supply, fill #5
  Filled 2024-02-16: qty 1.5, 30d supply, fill #5

## 2023-05-28 MED ORDER — SUMATRIPTAN SUCCINATE 100 MG PO TABS
100.0000 mg | ORAL_TABLET | ORAL | 11 refills | Status: DC
  Filled 2023-05-28: qty 9, 20d supply, fill #0
  Filled 2023-07-31: qty 9, 30d supply, fill #0
  Filled 2023-08-28: qty 9, 30d supply, fill #1
  Filled 2023-09-23: qty 9, 30d supply, fill #2
  Filled 2023-11-30: qty 9, 30d supply, fill #3
  Filled 2023-12-29: qty 9, 30d supply, fill #4
  Filled 2024-02-05: qty 9, 30d supply, fill #5

## 2023-05-28 NOTE — Telephone Encounter (Signed)
 Pt notified would not be able to get there at 8am but will be there before 8:30 am

## 2023-06-01 ENCOUNTER — Other Ambulatory Visit (HOSPITAL_COMMUNITY): Payer: Self-pay

## 2023-06-04 ENCOUNTER — Other Ambulatory Visit (HOSPITAL_COMMUNITY): Payer: Self-pay

## 2023-06-04 ENCOUNTER — Telehealth: Payer: Self-pay

## 2023-06-04 ENCOUNTER — Telehealth: Payer: Self-pay | Admitting: Family Medicine

## 2023-06-04 ENCOUNTER — Other Ambulatory Visit: Payer: Self-pay

## 2023-06-04 ENCOUNTER — Encounter: Payer: Self-pay | Admitting: *Deleted

## 2023-06-04 NOTE — Telephone Encounter (Signed)
 Pharmacy Patient Advocate Encounter   Received notification from Physician's Office that prior authorization for AJOVY  (fremanezumab -vfrm) injection 225MG /1.5ML auto-injectors is required/requested.   Insurance verification completed.   The patient is insured through [facility] .   Per test claim: PA required; PA submitted to above mentioned insurance via CoverMyMeds Key/confirmation #/[facility] Status is pending

## 2023-06-04 NOTE — Telephone Encounter (Signed)
 Pt informed via mychart

## 2023-06-04 NOTE — Telephone Encounter (Signed)
 PA request has been Submitted. New Encounter has been or will be created for follow up. For additional info see Pharmacy Prior Auth telephone encounter from 06/04/2023.

## 2023-06-04 NOTE — Telephone Encounter (Signed)
 Pharmacy Patient Advocate Encounter  Received notification from [facility] that Prior Authorization for AJOVY  (fremanezumab -vfrm) injection 225MG /1.5ML auto-injectors has been APPROVED from 06/04/2023 to 06/03/2024. Ran test claim, Copay is $24.98 per 28DS or 1.34ml. This test claim was processed through [facility]- copay amounts may vary at other pharmacies due to pharmacy/plan contracts, or as the patient moves through the different stages of their insurance plan.   PA #/Case ID/Reference #: PA Case ID #: [id number]

## 2023-06-04 NOTE — Telephone Encounter (Signed)
 Pt has been informed by pharmacy that a PA is needed for  Fremanezumab -vfrm (AJOVY ) 225 MG/1.5ML SOSY

## 2023-06-05 ENCOUNTER — Other Ambulatory Visit: Payer: Self-pay | Admitting: Physician Assistant

## 2023-06-05 ENCOUNTER — Other Ambulatory Visit: Payer: Self-pay

## 2023-06-05 ENCOUNTER — Other Ambulatory Visit (HOSPITAL_COMMUNITY): Payer: Self-pay

## 2023-06-12 ENCOUNTER — Other Ambulatory Visit (HOSPITAL_COMMUNITY): Payer: Self-pay

## 2023-07-01 ENCOUNTER — Other Ambulatory Visit (HOSPITAL_COMMUNITY): Payer: Self-pay

## 2023-07-02 ENCOUNTER — Other Ambulatory Visit (HOSPITAL_COMMUNITY): Payer: Self-pay

## 2023-07-03 ENCOUNTER — Other Ambulatory Visit (HOSPITAL_COMMUNITY): Payer: Self-pay

## 2023-07-18 ENCOUNTER — Other Ambulatory Visit: Payer: Self-pay | Admitting: Physician Assistant

## 2023-07-31 ENCOUNTER — Other Ambulatory Visit (HOSPITAL_COMMUNITY): Payer: Self-pay

## 2023-08-01 ENCOUNTER — Other Ambulatory Visit (HOSPITAL_COMMUNITY): Payer: Self-pay

## 2023-08-19 ENCOUNTER — Other Ambulatory Visit: Payer: Self-pay | Admitting: Physician Assistant

## 2023-08-19 DIAGNOSIS — Z1231 Encounter for screening mammogram for malignant neoplasm of breast: Secondary | ICD-10-CM

## 2023-08-29 ENCOUNTER — Other Ambulatory Visit: Payer: Self-pay

## 2023-08-29 ENCOUNTER — Other Ambulatory Visit (HOSPITAL_COMMUNITY): Payer: Self-pay

## 2023-09-01 ENCOUNTER — Other Ambulatory Visit: Payer: Self-pay | Admitting: Physician Assistant

## 2023-09-07 ENCOUNTER — Other Ambulatory Visit (HOSPITAL_COMMUNITY): Payer: Self-pay

## 2023-09-09 ENCOUNTER — Ambulatory Visit
Admission: RE | Admit: 2023-09-09 | Discharge: 2023-09-09 | Disposition: A | Discharge status: Home / Self Care | Source: Ambulatory Visit | Attending: Physician Assistant | Admitting: Physician Assistant

## 2023-09-09 DIAGNOSIS — Z1231 Encounter for screening mammogram for malignant neoplasm of breast: Secondary | ICD-10-CM

## 2023-09-25 ENCOUNTER — Other Ambulatory Visit: Payer: Self-pay

## 2023-09-25 ENCOUNTER — Other Ambulatory Visit (HOSPITAL_COMMUNITY): Payer: Self-pay

## 2023-09-27 ENCOUNTER — Encounter: Payer: Self-pay | Admitting: Family Medicine

## 2023-09-27 ENCOUNTER — Ambulatory Visit (INDEPENDENT_AMBULATORY_CARE_PROVIDER_SITE_OTHER): Admitting: Family Medicine

## 2023-09-27 VITALS — BP 124/94 | HR 83 | Temp 97.0°F | Resp 18 | Wt 163.2 lb

## 2023-09-27 DIAGNOSIS — G8929 Other chronic pain: Secondary | ICD-10-CM

## 2023-09-27 DIAGNOSIS — F064 Anxiety disorder due to known physiological condition: Secondary | ICD-10-CM

## 2023-09-27 DIAGNOSIS — M25512 Pain in left shoulder: Secondary | ICD-10-CM | POA: Diagnosis not present

## 2023-09-27 MED ORDER — METHOCARBAMOL 500 MG PO TABS: 500.0000 mg | ORAL_TABLET | Freq: Three times a day (TID) | ORAL | 0 refills | Status: AC | PRN

## 2023-09-27 NOTE — Patient Instructions (Signed)
                         Contains text generated by Abridge.                                 Contains text generated by Abridge.
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
## 2023-09-30 ENCOUNTER — Other Ambulatory Visit (HOSPITAL_COMMUNITY): Payer: Self-pay

## 2023-09-30 DIAGNOSIS — F064 Anxiety disorder due to known physiological condition: Secondary | ICD-10-CM | POA: Insufficient documentation

## 2023-09-30 DIAGNOSIS — G8929 Other chronic pain: Secondary | ICD-10-CM | POA: Insufficient documentation

## 2023-10-01 ENCOUNTER — Other Ambulatory Visit (HOSPITAL_COMMUNITY): Payer: Self-pay

## 2023-10-04 ENCOUNTER — Other Ambulatory Visit (HOSPITAL_COMMUNITY): Payer: Self-pay

## 2023-10-07 ENCOUNTER — Other Ambulatory Visit (HOSPITAL_COMMUNITY): Payer: Self-pay

## 2023-10-24 ENCOUNTER — Other Ambulatory Visit: Payer: Self-pay

## 2023-10-24 ENCOUNTER — Ambulatory Visit
Admission: EM | Admit: 2023-10-24 | Discharge: 2023-10-24 | Disposition: A | Discharge status: Home / Self Care | Attending: Physician Assistant | Admitting: Physician Assistant

## 2023-10-24 ENCOUNTER — Encounter: Payer: Self-pay | Admitting: *Deleted

## 2023-10-24 DIAGNOSIS — N3001 Acute cystitis with hematuria: Secondary | ICD-10-CM

## 2023-10-24 LAB — POCT URINE DIPSTICK
Bilirubin, UA: NEGATIVE
Glucose, UA: NEGATIVE mg/dL
Ketones, POC UA: NEGATIVE mg/dL
Nitrite, UA: NEGATIVE
POC PROTEIN,UA: 100 — AB
Spec Grav, UA: 1.02 (ref 1.010–1.025)
Urobilinogen, UA: 0.2 U/dL
pH, UA: 7 (ref 5.0–8.0)

## 2023-10-24 MED ORDER — NITROFURANTOIN MONOHYD MACRO 100 MG PO CAPS: 100.0000 mg | ORAL_CAPSULE | Freq: Two times a day (BID) | ORAL | 0 refills | Status: AC

## 2023-10-24 MED ORDER — PHENAZOPYRIDINE HCL 200 MG PO TABS: 200.0000 mg | ORAL_TABLET | ORAL | 0 refills | Status: AC

## 2023-10-24 NOTE — ED Provider Notes (Signed)
 Alison Mahoney    CSN: [id number] Arrival date & time: 10/24/23  0806      History   Chief Complaint Chief Complaint  Patient presents with   Urinary Frequency    HPI Alison Mahoney is a 63 y.o. female.   Discussed the use of AI scribe software for clinical note transcription with the patient, who gave verbal consent to proceed.   The patient presents with urinary symptoms that have been ongoing for about 2-3 days. She reports urgency to urinate and a stingy sensation, though she describes it as not terrible. The patient mentions getting up to urinate even when little urine is produced, indicating frequency.  The patient denies any irritation, itching, or vaginal discharge. She also denies pain in her lower back or flank area. No nausea, vomiting, or fevers are reported.   The following sections of the patient's history were reviewed and updated as appropriate: allergies, current medications, past family history, past medical history, past social history, past surgical history, and problem list.     Past Medical History:  Diagnosis Date   Ankylosing spondylitis (HCC)    35 years ago   Hyperlipidemia    Hypertension    Migraines     Patient Active Problem List   Diagnosis Date Noted   Anxiety disorder due to general medical condition 09/30/2023   Chronic left shoulder pain 09/30/2023   Rosacea 02/16/2021   Neoplasm of uncertain behavior of skin 02/16/2021   History of squamous cell carcinoma of skin 02/16/2021   Vitamin D  deficiency 04/08/2020   Mixed incontinence urge and stress 04/08/2020   Adnexal mass 08/07/2019   Hyperlipidemia 08/07/2019   HTN (hypertension) 04/13/2016   Migraine 04/13/2016    Past Surgical History:  Procedure Laterality Date   ABDOMINAL HYSTERECTOMY  1988   carpometacarpal joint of left hand Left    March 2025   COLONOSCOPY     lt hand surgery  1985   Lt middle toe surgery  1977   OOPHORECTOMY Bilateral 08/05/2019   also  removed tumors from L & R ovary; benign    ovarian tumor removal Left 08/05/2019   2 lb, benign; @ [facility]   Rt thumb joint replacement  1982   SALPINGECTOMY Bilateral 08/05/2019   vaginal delivery x3  1979, 1982, 1986    OB History   No obstetric history on file.      Home Medications    Prior to Admission medications   Medication Sig Start Date End Date Taking? Authorizing Provider  nitrofurantoin , macrocrystal-monohydrate, (MACROBID ) 100 MG capsule Take 1 capsule (100 mg total) by mouth 2 (two) times daily. 10/24/23  Yes Iola Lukes, FNP  phenazopyridine  (PYRIDIUM ) 200 MG tablet Take 1 tablet (200 mg total) by mouth 3 (three) times daily at 8am, 3pm and bedtime for 2 days. 10/24/23 10/26/23 Yes Iola Lukes, FNP  b complex vitamins tablet Take 1 tablet by mouth daily.    [provider]  Fremanezumab -vfrm (AJOVY ) 225 MG/1.5ML SOSY Inject 225 mg into the skin every 30 (thirty) days. 05/28/23   Lomax, Amy, NP  losartan -hydrochlorothiazide  (HYZAAR) 50-12.5 MG tablet TAKE 1 TABLET BY MOUTH EVERY DAY 06/05/23   Job Lukes, PA  meloxicam  (MOBIC ) 15 MG tablet TAKE 1 TABLET (15 MG TOTAL) BY MOUTH DAILY. 06/05/23   Job Lukes, PA  methocarbamol  (ROBAXIN ) 500 MG tablet Take 1 tablet (500 mg total) by mouth every 8 (eight) hours as needed for muscle spasms. 09/27/23   Sebastian Beverley NOVAK, MD  polyethylene glycol (MIRALAX / GLYCOLAX) 17 g packet Take 17 g by mouth daily as needed.    [provider]  pravastatin  (PRAVACHOL ) 80 MG tablet TAKE 1 TABLET BY MOUTH EVERY DAY 09/02/23   Job Lukes, PA  SUMAtriptan  (IMITREX ) 100 MG tablet Take 1 tablet (100 mg total) by mouth at onset of migraine -may repeat in 2 hours as needed as directed 05/28/23   Cary No, NP    Family History Family History  Problem Relation Age of Onset   Arthritis Mother    Alzheimer's disease Mother    Arthritis Father    Healthy Daughter    Cancer Maternal Grandmother    Cancer  Paternal Grandmother    Cancer Paternal Grandfather    Diabetes Paternal Grandfather    Breast cancer Cousin    Diabetes Brother    Healthy Son    Healthy Son    Colon cancer Neg Hx    Esophageal cancer Neg Hx    Pancreatic cancer Neg Hx    Stomach cancer Neg Hx     Social History Social History   Tobacco Use   Smoking status: Never   Smokeless tobacco: Never  Vaping Use   Vaping status: Never Used  Substance Use Topics   Alcohol use: Never   Drug use: Never     Allergies   Inderal [propranolol] and Tomato   Review of Systems Review of Systems   Physical Exam Triage Vital Signs ED Triage Vitals  Encounter Vitals Group     BP 10/24/23 0827 118/82     Girls Systolic BP Percentile --      Girls Diastolic BP Percentile --      Boys Systolic BP Percentile --      Boys Diastolic BP Percentile --      Pulse Rate 10/24/23 0827 76     Resp 10/24/23 0827 18     Temp 10/24/23 0827 98 F (36.7 C)     Temp Source 10/24/23 0827 Oral     SpO2 10/24/23 0827 97 %     Weight --      Height --      Head Circumference --      Peak Flow --      Pain Score 10/24/23 0825 5     Pain Loc --      Pain Education --      Exclude from Growth Chart --    No data found.  Updated Vital Signs BP 118/82 (BP Location: Left Arm)   Pulse 76   Temp 98 F (36.7 C) (Oral)   Resp 18   SpO2 97%   Visual Acuity Right Eye Distance:   Left Eye Distance:   Bilateral Distance:    Right Eye Near:   Left Eye Near:    Bilateral Near:     Physical Exam Vitals and nursing note reviewed.  Constitutional:      General: She is not in acute distress.    Appearance: Normal appearance. She is well-developed. She is not ill-appearing, toxic-appearing or diaphoretic.  HENT:     Head: Normocephalic and atraumatic.     Nose: Nose normal.     Mouth/Throat:     Mouth: Mucous membranes are moist.  Eyes:     Conjunctiva/sclera: Conjunctivae normal.  Cardiovascular:     Rate and Rhythm:  Normal rate and regular rhythm.     Heart sounds: No murmur heard. Pulmonary:     Effort: Pulmonary effort is normal.  No respiratory distress.     Breath sounds: Normal breath sounds.  Abdominal:     Palpations: Abdomen is soft.     Tenderness: There is no abdominal tenderness. There is no right CVA tenderness or left CVA tenderness.  Musculoskeletal:        General: No swelling. Normal range of motion.     Cervical back: Normal range of motion and neck supple.  Skin:    General: Skin is warm and dry.     Capillary Refill: Capillary refill takes less than 2 seconds.  Neurological:     General: No focal deficit present.     Mental Status: She is alert and oriented to person, place, and time.  Psychiatric:        Mood and Affect: Mood normal.        Behavior: Behavior normal.      UC Treatments / Results  Labs (all labs ordered are listed, but only abnormal results are displayed) Labs Reviewed  POCT URINE DIPSTICK - Abnormal; Notable for the following components:      Result Value   Clarity, UA cloudy (*)    Blood, UA large (*)    POC PROTEIN,UA =100 (*)    Leukocytes, UA Small (1+) (*)    All other components within normal limits  URINE CULTURE    EKG   Radiology No results found.  Procedures Procedures (including critical Mahoney time)  Medications Ordered in UC Medications - No data to display  Initial Impression / Assessment and Plan / UC Course  I have reviewed the triage vital signs and the nursing notes.  Pertinent labs & imaging results that were available during my Mahoney of the patient were reviewed by me and considered in my medical decision making (see chart for details).     Patient presents with symptoms consistent with a urinary tract infection. Urinalysis reveals microscopic hematuria and leukocyte esterase, supporting the diagnosis. Nitrofurantoin  was prescribed to be taken twice daily for 5 days. Pyridium  was prescribed for urinary discomfort, to be  taken three times daily for 2 days, with counseling that it may turn the urine orange. A urine culture was sent to identify the causative organism and assess antibiotic sensitivity. Patient was advised that they will be contacted only if the culture results require a change in treatment; otherwise, results can be reviewed via MyChart. Patient instructed to increase fluid intake and monitor symptoms. Follow up with primary Mahoney provider if symptoms do not improve or worsen. Emergency evaluation is warranted for fever, back or flank pain, nausea, vomiting, or signs of systemic illness.  Today's evaluation has revealed no signs of a dangerous process. Discussed diagnosis with patient and/or guardian. Patient and/or guardian aware of their diagnosis, possible red flag symptoms to watch out for and need for close follow up. Patient and/or guardian understands verbal and written discharge instructions. Patient and/or guardian comfortable with plan and disposition.  Patient and/or guardian has a clear mental status at this time, good insight into illness (after discussion and teaching) and has clear judgment to make decisions regarding their Mahoney  Documentation was completed with the aid of voice recognition software. Transcription may contain typographical errors.   Final Clinical Impressions(s) / UC Diagnoses   Final diagnoses:  Acute cystitis with hematuria     Discharge Instructions      You were seen today for symptoms consistent with a urinary tract infection (UTI). You have been prescribed Macrobid  to treat the infection and Pyridium  to  help relieve discomfort such as burning, urgency, and bladder pressure. Take the antibiotics exactly as prescribed and complete the full course, even if you start feeling better. Pyridium   may cause your urine to change color, which is a normal side effect of the medication. A urine culture has been sent to identify the specific bacteria causing the infection and to  confirm that the prescribed antibiotic is appropriate. You will only be contacted if your results are abnormal; otherwise, you may review them in your MyChart account.   It is important to stay well hydrated by drinking plenty of fluids throughout the day. This helps flush out your urinary system and keeps your urine light yellow, which is a sign of good hydration. Avoid caffeine and alcohol, as they can irritate the bladder. Be sure to urinate regularly and empty your bladder fully. Do not hold your urine for extended periods. Always wipe from front to back after using the bathroom and use a clean tissue for each wipe. It is also important to urinate after sexual activity. Avoid douching or using sprays or powders in the genital area, as these can cause irritation. Follow up with your healthcare provider if your symptoms do not improve within a few days, get worse, or return after completing your treatment.        ED Prescriptions     Medication Sig Dispense Auth. Provider   nitrofurantoin , macrocrystal-monohydrate, (MACROBID ) 100 MG capsule Take 1 capsule (100 mg total) by mouth 2 (two) times daily. 10 capsule Iola Lukes, FNP   phenazopyridine  (PYRIDIUM ) 200 MG tablet Take 1 tablet (200 mg total) by mouth 3 (three) times daily at 8am, 3pm and bedtime for 2 days. 6 tablet Iola Lukes, FNP      PDMP not reviewed this encounter.   Iola Lukes, [place] 10/24/23 [phone]

## 2023-10-24 NOTE — Discharge Instructions (Signed)
 You were seen today for symptoms consistent with a urinary tract infection (UTI). You have been prescribed Macrobid  to treat the infection and Pyridium  to help relieve discomfort such as burning, urgency, and bladder pressure. Take the antibiotics exactly as prescribed and complete the full course, even if you start feeling better. Pyridium  may cause your urine to change color, which is a normal side effect of the medication. A urine culture has been sent to identify the specific bacteria causing the infection and to confirm that the prescribed antibiotic is appropriate. You will only be contacted if your results are abnormal; otherwise, you may review them in your MyChart account.   It is important to stay well hydrated by drinking plenty of fluids throughout the day. This helps flush out your urinary system and keeps your urine light yellow, which is a sign of good hydration. Avoid caffeine and alcohol, as they can irritate the bladder. Be sure to urinate regularly and empty your bladder fully. Do not hold your urine for extended periods. Always wipe from front to back after using the bathroom and use a clean tissue for each wipe. It is also important to urinate after sexual activity. Avoid douching or using sprays or powders in the genital area, as these can cause irritation. Follow up with your healthcare provider if your symptoms do not improve within a few days, get worse, or return after completing your treatment.

## 2023-10-24 NOTE — ED Triage Notes (Signed)
 Pt reports urgency, frequency, burning x 2 days

## 2023-10-26 LAB — URINE CULTURE: Culture: >=100000 — AB

## 2023-10-28 ENCOUNTER — Ambulatory Visit: Payer: Self-pay

## 2023-10-28 MED ORDER — SULFAMETHOXAZOLE-TRIMETHOPRIM 800-160 MG PO TABS: 1.0000 | ORAL_TABLET | Freq: Two times a day (BID) | ORAL | 0 refills | Status: AC

## 2023-11-26 ENCOUNTER — Other Ambulatory Visit: Payer: Self-pay | Admitting: Physician Assistant

## 2023-11-27 ENCOUNTER — Other Ambulatory Visit (HOSPITAL_COMMUNITY): Payer: Self-pay

## 2023-11-30 ENCOUNTER — Other Ambulatory Visit (HOSPITAL_COMMUNITY): Payer: Self-pay

## 2023-12-18 ENCOUNTER — Other Ambulatory Visit (HOSPITAL_COMMUNITY): Payer: Self-pay

## 2024-02-12 ENCOUNTER — Ambulatory Visit: Admission: EM | Admit: 2024-02-12 | Discharge: 2024-02-12 | Disposition: A | Discharge status: Home / Self Care

## 2024-02-12 ENCOUNTER — Encounter: Payer: Self-pay | Admitting: Emergency Medicine

## 2024-02-12 DIAGNOSIS — R300 Dysuria: Secondary | ICD-10-CM | POA: Diagnosis not present

## 2024-02-12 DIAGNOSIS — R350 Frequency of micturition: Secondary | ICD-10-CM | POA: Insufficient documentation

## 2024-02-12 DIAGNOSIS — L570 Actinic keratosis: Secondary | ICD-10-CM | POA: Insufficient documentation

## 2024-02-12 LAB — POCT URINE DIPSTICK
Bilirubin, UA: NEGATIVE
Blood, UA: NEGATIVE
Glucose, UA: NEGATIVE mg/dL
Ketones, POC UA: NEGATIVE mg/dL
Leukocytes, UA: NEGATIVE
Nitrite, UA: NEGATIVE
POC PROTEIN,UA: NEGATIVE
Spec Grav, UA: 1.01
Urobilinogen, UA: 0.2 U/dL
pH, UA: 6.5

## 2024-02-12 MED ORDER — PHENAZOPYRIDINE HCL 200 MG PO TABS: 200.0000 mg | ORAL_TABLET | Freq: Three times a day (TID) | ORAL | 0 refills | Status: AC

## 2024-02-12 NOTE — ED Triage Notes (Signed)
 Pt presents c/o UTI sxs x 2 days. Pt states,  I have a lot of irritation and frequent urination. I was up and down all night. I also have a bleeding hemorrhoid just in case you see blood in my urine.

## 2024-02-12 NOTE — Discharge Instructions (Addendum)
 Will hold off on antibiotics at this time, will use pyridium  until urine culture returns

## 2024-02-12 NOTE — ED Provider Notes (Signed)
 " [name]    CSN: [id number] Arrival date & time: 02/12/24  1259      History   Chief Complaint Chief Complaint  Patient presents with   UTI    HPI Alison Mahoney is a 63 y.o. female.   Pt presents today due to 2 days of dysuria and frequency. Pt denies fever, chills, nausea, or vomiting.   The history is provided by the patient.    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   Chronic left shoulder pain 09/30/2023   Rosacea 02/16/2021   Neoplasm of uncertain behavior of skin 02/16/2021   History of squamous cell carcinoma of skin 02/16/2021   Vitamin D  deficiency 04/08/2020   Mixed incontinence urge and stress 04/08/2020   Adnexal mass 08/07/2019   Hyperlipidemia 08/07/2019   HTN (hypertension) 04/13/2016   Migraine 04/13/2016    Past Surgical History:  Procedure Laterality Date   ABDOMINAL HYSTERECTOMY  1988   carpometacarpal joint of left hand Left    March 2025   COLONOSCOPY     lt hand surgery  1985   Lt middle toe surgery  1977   OOPHORECTOMY Bilateral 08/05/2019   also removed tumors from L & R ovary; benign    ovarian tumor removal Left 08/05/2019   2 lb, benign; @ [facility]   Rt thumb joint replacement  1982   SALPINGECTOMY Bilateral 08/05/2019   vaginal delivery x3  1979, 1982, 1986    OB History   No obstetric history on file.      Home Medications    Prior to Admission medications  Medication Sig Start Date End Date Taking? Authorizing Provider  losartan -hydrochlorothiazide  (HYZAAR) 50-12.5 MG tablet TAKE 1 TABLET BY MOUTH EVERY DAY 11/26/23  Yes Job Lukes, PA  phenazopyridine  (PYRIDIUM ) 200 MG tablet Take 1 tablet (200 mg total) by mouth 3 (three) times daily. 02/12/24   Yes Andra Krabbe C, PA-C  pravastatin  (PRAVACHOL ) 80 MG tablet TAKE 1 TABLET BY MOUTH EVERY DAY 09/02/23  Yes Job Lukes, PA  b complex vitamins tablet Take 1 tablet by mouth daily.    [provider]  Fremanezumab -vfrm (AJOVY ) 225 MG/1.5ML SOSY Inject 225 mg into the skin every 30 (thirty) days. 05/28/23   Lomax, Amy, NP  meloxicam  (MOBIC ) 15 MG tablet TAKE 1 TABLET (15 MG TOTAL) BY MOUTH DAILY. 06/05/23   Job Lukes, PA  methocarbamol  (ROBAXIN ) 500 MG tablet Take 1 tablet (500 mg total) by mouth every 8 (eight) hours as needed for muscle spasms. 09/27/23   Sebastian Beverley NOVAK, MD  nitrofurantoin , macrocrystal-monohydrate, (MACROBID ) 100 MG capsule Take 1 capsule (100 mg total) by mouth 2 (two) times daily. 10/24/23   Murrill, Samantha, FNP  polyethylene glycol (MIRALAX / GLYCOLAX) 17 g packet Take 17 g by mouth daily as needed.    [provider]  SUMAtriptan  (IMITREX ) 100 MG tablet Take 1 tablet (100 mg total) by mouth at onset of migraine -may repeat in 2 hours as needed as directed 05/28/23   Cary No, NP    Family History Family History  Problem Relation Age of Onset   Arthritis Mother    Alzheimer's disease Mother    Arthritis Father  Healthy Daughter    Cancer Maternal Grandmother    Cancer Paternal Grandmother    Cancer Paternal Grandfather    Diabetes Paternal Grandfather    Breast cancer Cousin    Diabetes Brother    Healthy Son    Healthy Son    Colon cancer Neg Hx    Esophageal cancer Neg Hx    Pancreatic cancer Neg Hx    Stomach cancer Neg Hx     Social History Social History[1]   Allergies   Propranolol and Tomato   Review of Systems Review of Systems   Physical Exam Triage Vital Signs ED Triage Vitals  Encounter Vitals Group     BP 02/12/24 1359 (!) 151/113     Girls Systolic BP Percentile --      Girls Diastolic BP Percentile --      Boys Systolic BP Percentile --      Boys Diastolic BP Percentile --      Pulse  Rate 02/12/24 1359 93     Resp 02/12/24 1359 18     Temp 02/12/24 1359 98.1 F (36.7 C)     Temp Source 02/12/24 1359 Oral     SpO2 02/12/24 1359 98 %     Weight 02/12/24 1358 163 lb 2.3 oz (74 kg)     Height --      Head Circumference --      Peak Flow --      Pain Score 02/12/24 1357 0     Pain Loc --      Pain Education --      Exclude from Growth Chart --    No data found.  Updated Vital Signs BP (!) 151/113 (BP Location: Left Arm)   Pulse 93   Temp 98.1 F (36.7 C) (Oral)   Resp 18   Wt 163 lb 2.3 oz (74 kg)   SpO2 98%   BMI 28.00 kg/m   Visual Acuity Right Eye Distance:   Left Eye Distance:   Bilateral Distance:    Right Eye Near:   Left Eye Near:    Bilateral Near:     Physical Exam Vitals and nursing note reviewed.  Constitutional:      General: She is not in acute distress.    Appearance: Normal appearance. She is not ill-appearing, toxic-appearing or diaphoretic.  Eyes:     General: No scleral icterus. Cardiovascular:     Rate and Rhythm: Normal rate and regular rhythm.     Heart sounds: Normal heart sounds.  Pulmonary:     Effort: Pulmonary effort is normal. No respiratory distress.     Breath sounds: Normal breath sounds. No wheezing or rhonchi.  Abdominal:     General: Abdomen is flat. Bowel sounds are normal.     Palpations: Abdomen is soft.     Tenderness: There is no abdominal tenderness. There is no right CVA tenderness or left CVA tenderness.  Skin:    General: Skin is warm.  Neurological:     Mental Status: She is alert and oriented to person, place, and time.  Psychiatric:        Mood and Affect: Mood normal.        Behavior: Behavior normal.      UC Treatments / Results  Labs (all labs ordered are listed, but only abnormal results are displayed) Labs Reviewed  POCT URINE DIPSTICK    EKG   Radiology No results found.  Procedures Procedures (including critical care time)  Medications Ordered  in UC Medications - No  data to display  Initial Impression / Assessment and Plan / UC Course  I have reviewed the triage vital signs and the nursing notes.  Pertinent labs & imaging results that were available during my care of the patient were reviewed by me and considered in my medical decision making (see chart for details).     Final Clinical Impressions(s) / UC Diagnoses   Final diagnoses:  Dysuria  Urinary frequency   Discharge Instructions   None    ED Prescriptions     Medication Sig Dispense Auth. Provider   phenazopyridine  (PYRIDIUM ) 200 MG tablet Take 1 tablet (200 mg total) by mouth 3 (three) times daily. 6 tablet Andra Corean BROCKS, PA-C      PDMP not reviewed this encounter.    [1]  Social History Tobacco Use   Smoking status: Never   Smokeless tobacco: Never  Vaping Use   Vaping status: Never Used  Substance Use Topics   Alcohol use: Never   Drug use: Never     Andra Corean BROCKS, PA-C 02/12/24 1425  "

## 2024-02-14 ENCOUNTER — Ambulatory Visit (HOSPITAL_COMMUNITY): Payer: Self-pay

## 2024-02-14 LAB — URINE CULTURE: Culture: <10000 — AB

## 2024-02-15 ENCOUNTER — Other Ambulatory Visit (HOSPITAL_COMMUNITY): Payer: Self-pay

## 2024-02-16 ENCOUNTER — Other Ambulatory Visit (HOSPITAL_COMMUNITY): Payer: Self-pay

## 2024-02-24 ENCOUNTER — Encounter: Payer: Self-pay | Admitting: Family Medicine

## 2024-02-25 MED ORDER — SUMATRIPTAN SUCCINATE 100 MG PO TABS: 100.0000 mg | ORAL_TABLET | ORAL | 11 refills | Status: AC

## 2024-02-28 ENCOUNTER — Other Ambulatory Visit (HOSPITAL_COMMUNITY): Payer: Self-pay

## 2024-03-20 ENCOUNTER — Other Ambulatory Visit: Payer: Self-pay | Admitting: Physician Assistant

## 2024-06-02 ENCOUNTER — Ambulatory Visit: Payer: PRIVATE HEALTH INSURANCE | Admitting: Family Medicine
